# Patient Record
Sex: Female | Born: 1965
Health system: Southern US, Community
[De-identification: ages and names within clinical notes are randomized; demographics above are authoritative.]

## PROBLEM LIST (undated history)

## (undated) DIAGNOSIS — H81399 Other peripheral vertigo, unspecified ear: Secondary | ICD-10-CM

## (undated) DIAGNOSIS — M1990 Unspecified osteoarthritis, unspecified site: Secondary | ICD-10-CM

## (undated) DIAGNOSIS — H409 Unspecified glaucoma: Secondary | ICD-10-CM

## (undated) DIAGNOSIS — K219 Gastro-esophageal reflux disease without esophagitis: Secondary | ICD-10-CM

## (undated) DIAGNOSIS — Z9289 Personal history of other medical treatment: Secondary | ICD-10-CM

## (undated) DIAGNOSIS — R51 Headache: Secondary | ICD-10-CM

## (undated) DIAGNOSIS — Z800 Family history of malignant neoplasm of digestive organs: Secondary | ICD-10-CM

## (undated) DIAGNOSIS — Z803 Family history of malignant neoplasm of breast: Secondary | ICD-10-CM

## (undated) DIAGNOSIS — I341 Nonrheumatic mitral (valve) prolapse: Secondary | ICD-10-CM

## (undated) DIAGNOSIS — I10 Essential (primary) hypertension: Secondary | ICD-10-CM

## (undated) HISTORY — DX: Family history of malignant neoplasm of digestive organs: Z80.0

## (undated) HISTORY — DX: Unspecified glaucoma: H40.9

## (undated) HISTORY — DX: Nonrheumatic mitral (valve) prolapse: I34.1

## (undated) HISTORY — PX: KNEE ARTHROSCOPY: SUR90

## (undated) HISTORY — DX: Gastro-esophageal reflux disease without esophagitis: K21.9

## (undated) HISTORY — DX: Family history of malignant neoplasm of breast: Z80.3

## (undated) HISTORY — DX: Essential (primary) hypertension: I10

## (undated) HISTORY — DX: Other peripheral vertigo, unspecified ear: H81.399

---

## 2003-12-02 ENCOUNTER — Other Ambulatory Visit: Admission: RE | Admit: 2003-12-02 | Discharge: 2003-12-02 | Payer: Self-pay | Admitting: Obstetrics and Gynecology

## 2003-12-02 ENCOUNTER — Other Ambulatory Visit: Admission: RE | Admit: 2003-12-02 | Discharge: 2003-12-02 | Payer: Self-pay | Admitting: Internal Medicine

## 2003-12-28 ENCOUNTER — Ambulatory Visit (HOSPITAL_COMMUNITY): Admission: RE | Admit: 2003-12-28 | Discharge: 2003-12-28 | Payer: Self-pay | Admitting: Family Medicine

## 2005-07-10 ENCOUNTER — Other Ambulatory Visit: Admission: RE | Admit: 2005-07-10 | Discharge: 2005-07-10 | Payer: Self-pay | Admitting: Obstetrics and Gynecology

## 2009-04-23 ENCOUNTER — Inpatient Hospital Stay (HOSPITAL_COMMUNITY): Admission: AD | Admit: 2009-04-23 | Discharge: 2009-04-23 | Payer: Self-pay | Admitting: Obstetrics and Gynecology

## 2010-11-10 LAB — GC/CHLAMYDIA PROBE AMP, URINE
Chlamydia, Swab/Urine, PCR: NEGATIVE
GC Probe Amp, Urine: NEGATIVE

## 2010-11-10 LAB — CBC
Hemoglobin: 12.9 g/dL (ref 12.0–15.0)
RBC: 3.92 MIL/uL (ref 3.87–5.11)
WBC: 8 10*3/uL (ref 4.0–10.5)

## 2011-03-22 ENCOUNTER — Other Ambulatory Visit: Payer: Self-pay | Admitting: Family Medicine

## 2011-03-22 ENCOUNTER — Ambulatory Visit
Admission: RE | Admit: 2011-03-22 | Discharge: 2011-03-22 | Disposition: A | Discharge status: Home / Self Care | Payer: BC Managed Care – PPO | Source: Ambulatory Visit | Attending: Family Medicine | Admitting: Family Medicine

## 2011-03-22 DIAGNOSIS — R42 Dizziness and giddiness: Secondary | ICD-10-CM

## 2012-08-06 DIAGNOSIS — Z9289 Personal history of other medical treatment: Secondary | ICD-10-CM

## 2012-08-06 HISTORY — DX: Personal history of other medical treatment: Z92.89

## 2012-12-02 ENCOUNTER — Encounter: Payer: Self-pay | Admitting: Internal Medicine

## 2012-12-02 ENCOUNTER — Ambulatory Visit (INDEPENDENT_AMBULATORY_CARE_PROVIDER_SITE_OTHER): Payer: BC Managed Care – PPO | Admitting: Internal Medicine

## 2012-12-02 VITALS — BP 170/110 | HR 87 | Temp 97.8°F | Resp 18 | Ht 69.0 in | Wt 219.0 lb

## 2012-12-02 DIAGNOSIS — I059 Rheumatic mitral valve disease, unspecified: Secondary | ICD-10-CM

## 2012-12-02 DIAGNOSIS — I10 Essential (primary) hypertension: Secondary | ICD-10-CM | POA: Insufficient documentation

## 2012-12-02 DIAGNOSIS — H409 Unspecified glaucoma: Secondary | ICD-10-CM | POA: Insufficient documentation

## 2012-12-02 DIAGNOSIS — Z780 Asymptomatic menopausal state: Secondary | ICD-10-CM | POA: Insufficient documentation

## 2012-12-02 DIAGNOSIS — Z872 Personal history of diseases of the skin and subcutaneous tissue: Secondary | ICD-10-CM | POA: Insufficient documentation

## 2012-12-02 DIAGNOSIS — F329 Major depressive disorder, single episode, unspecified: Secondary | ICD-10-CM

## 2012-12-02 DIAGNOSIS — F32A Depression, unspecified: Secondary | ICD-10-CM | POA: Insufficient documentation

## 2012-12-02 DIAGNOSIS — I341 Nonrheumatic mitral (valve) prolapse: Secondary | ICD-10-CM

## 2012-12-02 LAB — CBC WITH DIFFERENTIAL/PLATELET
Basophils Absolute: 0 10*3/uL (ref 0.0–0.1)
Basophils Relative: 1 % (ref 0–1)
HCT: 37.1 % (ref 36.0–46.0)
Hemoglobin: 12.6 g/dL (ref 12.0–15.0)
Monocytes Absolute: 0.2 10*3/uL (ref 0.1–1.0)
Monocytes Relative: 4 % (ref 3–12)
Neutro Abs: 2.5 10*3/uL (ref 1.7–7.7)
Neutrophils Relative %: 56 % (ref 43–77)
Platelets: 234 10*3/uL (ref 150–400)
RBC: 4.23 MIL/uL (ref 3.87–5.11)
RDW: 13.5 % (ref 11.5–15.5)
WBC: 4.3 10*3/uL (ref 4.0–10.5)

## 2012-12-02 LAB — COMPREHENSIVE METABOLIC PANEL
AST: 22 U/L (ref 0–37)
Albumin: 4.9 g/dL (ref 3.5–5.2)
Alkaline Phosphatase: 94 U/L (ref 39–117)
Glucose, Bld: 100 mg/dL — ABNORMAL HIGH (ref 70–99)
Potassium: 4.6 mEq/L (ref 3.5–5.3)
Sodium: 138 mEq/L (ref 135–145)
Total Bilirubin: 0.6 mg/dL (ref 0.3–1.2)

## 2012-12-02 LAB — LIPID PANEL
HDL: 65 mg/dL (ref >39)
LDL Cholesterol: 101 mg/dL — ABNORMAL HIGH (ref 0–99)
Total CHOL/HDL Ratio: 2.8 Ratio

## 2012-12-02 LAB — TSH: TSH: 0.987 u[IU]/mL (ref 0.350–4.500)

## 2012-12-02 MED ORDER — AMLODIPINE BESYLATE 5 MG PO TABS: 5.0000 mg | ORAL_TABLET | Freq: Every day | ORAL | Status: DC

## 2012-12-02 MED ORDER — FUROSEMIDE 20 MG PO TABS: 20.0000 mg | ORAL_TABLET | Freq: Every day | ORAL | Status: DC

## 2012-12-02 MED ORDER — POTASSIUM CHLORIDE CRYS ER 20 MEQ PO TBCR
20.0000 meq | EXTENDED_RELEASE_TABLET | Freq: Every day | ORAL | Status: DC
Start: 2012-12-02 — End: 2013-04-16

## 2012-12-02 MED ORDER — CLONIDINE HCL 0.1 MG PO TABS
0.1000 mg | ORAL_TABLET | Freq: Once | ORAL | Status: AC
  Administered 2012-12-02: 0.1 mg via ORAL

## 2012-12-02 NOTE — Patient Instructions (Addendum)
See me in 2 weeks  Take medication as prescribed

## 2012-12-02 NOTE — Progress Notes (Signed)
Subjective:    Patient ID: Leah Moore, female    DOB: 1966-07-16, 47 y.o.   MRN: [id number]  HPI New pt here for first visit.    Leah Moore works at [employer].  Former care Dr. Nicholos Johns who she has not seen in quite a while  PMH of HTN since age 76,  depession off meds now, glaucoma, MVP, and chronic urticaria vs angioedema  ( lip swells randomly and when on Lisinopril).    Leah Moore reports that she "fell off the health wagon" as she was caring for her ill mother who lives in [place].  She has been off her BP meds for the last month and she does have a headache off and on.  She also tells me she cannot afford the Bystolic.  She has edema in hands and ankles off and on.  No swelling today.  See BP today  No speech or visual changes no paresthesias or muscle weakness  She was on  Zoloft and Cymbalta but off meds for the past 4-5 years now.    Diagnosed with MVP by Dr. Kemper Durie in the early 1990's   No recent chest pain or palpitaitons  She had lip swelling with Lisinopril and she described intermittant hives off and on that she takes benadryl for.    She also describes that she has had an endometrial biopsy at [facility] for heavy bleeding.  She was on OC's temporarily  Her LMP was in 08/2009  .  Her last mm was Oct 2013 at [facility]  She does describe frequent hot flushes and night sweats.    Allergies  Allergen Reactions  . Lisinopril    Past Medical History  Diagnosis Date  . Hypertension   . Glaucoma   . Mitral valve prolapse   . Vestibular vertigo    Past Surgical History  Procedure Laterality Date  . Knee arthroscopy     History   Social History  . Marital Status: Single    Spouse Name: N/A    Number of Children: N/A  . Years of Education: N/A   Occupational History  . Not on file.   Social History Main Topics  . Smoking status: Never Smoker   . Smokeless tobacco: Not on file  . Alcohol Use: Yes     Comment: socially  . Drug Use: No  . Sexually Active:  Yes    Birth Control/ Protection: Condom   Other Topics Concern  . Not on file   Social History Narrative  . No narrative on file   Family History  Problem Relation Age of Onset  . Arthritis Mother   . Hypertension Mother   . Diabetes Mother   . Glaucoma Mother   . COPD Mother   . Hypertension Father   . Stroke Father   . Hypertension Brother   . Glaucoma Brother   . Stroke Maternal Grandmother   . Heart disease Maternal Grandmother   . COPD Maternal Grandfather   . Heart disease Maternal Grandfather   . Cancer Maternal Grandfather     pancreatic  . Cancer Paternal Grandmother   . Hypertension Paternal Grandmother   . Arthritis Paternal Grandmother   . Heart disease Paternal Grandfather    There are no active problems to display for this patient.  No current outpatient prescriptions on file prior to visit.   No current facility-administered medications on file prior to visit.       Review of Systems See HPI  Objective:   Physical Exam Physical Exam  Nursing note and vitals reviewed.  Constitutional: She is oriented to person, place, and time. She appears well-developed and well-nourished.  HENT:  Head: Normocephalic and atraumatic.  Cardiovascular: Normal rate and regular rhythm. Exam reveals no gallop and no friction rub.  No murmur heard.  Pulmonary/Chest: Breath sounds normal. She has no wheezes. She has no rales.  Neurological: She is alert and oriented to person, place, and time.  Skin: Skin is warm and dry.  Psychiatric: She has a normal mood and affect. Her behavior is normal.  Ext no edema today        Assessment & Plan:  HTN uncontrolled.  Will give Clonidine 0.1 mg in office. EKG in office today  Poor R wave progression  Will order Amlodipine 5 mg daily along with  Lasix 20 mg daily with K-dur for K replacement.  She is to see me in  2 weeks or sooner as needed  MVP will get 2-D echo.    Chronic urticaria versus angioedema  Will refer  to allergist at some point  Glaucoma  Managed at [facility] today  See me in 2 weeks

## 2012-12-03 ENCOUNTER — Other Ambulatory Visit: Payer: Self-pay | Admitting: Internal Medicine

## 2012-12-03 ENCOUNTER — Encounter: Payer: Self-pay | Admitting: Internal Medicine

## 2012-12-03 DIAGNOSIS — E559 Vitamin D deficiency, unspecified: Secondary | ICD-10-CM | POA: Insufficient documentation

## 2012-12-03 LAB — VITAMIN D 25 HYDROXY (VIT D DEFICIENCY, FRACTURES): Vit D, 25-Hydroxy: 10 ng/mL — ABNORMAL LOW (ref 30–89)

## 2012-12-03 MED ORDER — ERGOCALCIFEROL 1.25 MG (50000 UT) PO CAPS: 50000.0000 [IU] | ORAL_CAPSULE | ORAL | Status: DC

## 2012-12-04 ENCOUNTER — Telehealth: Payer: Self-pay | Admitting: *Deleted

## 2012-12-04 ENCOUNTER — Encounter: Payer: Self-pay | Admitting: *Deleted

## 2012-12-04 ENCOUNTER — Encounter: Payer: Self-pay | Admitting: Internal Medicine

## 2012-12-04 NOTE — Telephone Encounter (Signed)
[facility] notified MM results will be faxed

## 2012-12-04 NOTE — Telephone Encounter (Signed)
Message copied by Mathews Robinsons on Thu Dec 04, 2012 11:23 AM ------      Message from: Raechel Chute D      Created: Wed Dec 03, 2012 11:28 AM       [name]            Call pt and let her know that her vitamin D level is very low.  I sent e-script in epic for Vitamin D 50,000 units to be taken once a week for 8 weeks and then counsel her to take 2000 units OTC daily.  Keep follow up appt with me            Ok to mail labs to her ------

## 2012-12-04 NOTE — Telephone Encounter (Signed)
Message copied by Mathews Robinsons on Thu Dec 04, 2012 11:12 AM ------      Message from: Raechel Chute D      Created: Wed Dec 03, 2012 11:28 AM       [name]            Call pt and let her know that her vitamin D level is very low.  I sent e-script in epic for Vitamin D 50,000 units to be taken once a week for 8 weeks and then counsel her to take 2000 units OTC daily.  Keep follow up appt with me            Ok to mail labs to her ------

## 2012-12-09 ENCOUNTER — Ambulatory Visit (HOSPITAL_COMMUNITY)
Admission: RE | Admit: 2012-12-09 | Discharge: 2012-12-09 | Disposition: A | Discharge status: Home / Self Care | Payer: BC Managed Care – PPO | Source: Ambulatory Visit | Attending: Internal Medicine | Admitting: Internal Medicine

## 2012-12-09 DIAGNOSIS — I341 Nonrheumatic mitral (valve) prolapse: Secondary | ICD-10-CM

## 2012-12-09 DIAGNOSIS — I10 Essential (primary) hypertension: Secondary | ICD-10-CM | POA: Insufficient documentation

## 2012-12-09 DIAGNOSIS — I059 Rheumatic mitral valve disease, unspecified: Secondary | ICD-10-CM | POA: Insufficient documentation

## 2012-12-09 NOTE — Progress Notes (Signed)
  Echocardiogram 2D Echocardiogram has been performed.  Leah Moore 12/09/2012, 12:06 PM

## 2012-12-11 ENCOUNTER — Telehealth: Payer: Self-pay | Admitting: *Deleted

## 2012-12-11 NOTE — Telephone Encounter (Signed)
LVM regarding 2 D echo

## 2012-12-11 NOTE — Telephone Encounter (Signed)
Message copied by Mathews Robinsons on Thu Dec 11, 2012  5:20 PM ------      Message from: Raechel Chute D      Created: Thu Dec 11, 2012  3:21 PM       [facility]            Call pt and let her know that her heart ECHO shows no mitral valve prolapse            Keep follow up appt with me ------

## 2012-12-16 ENCOUNTER — Ambulatory Visit (INDEPENDENT_AMBULATORY_CARE_PROVIDER_SITE_OTHER): Payer: BC Managed Care – PPO | Admitting: Internal Medicine

## 2012-12-16 ENCOUNTER — Encounter: Payer: Self-pay | Admitting: Internal Medicine

## 2012-12-16 ENCOUNTER — Telehealth: Payer: Self-pay | Admitting: Internal Medicine

## 2012-12-16 VITALS — BP 140/98 | HR 96 | Temp 97.3°F | Resp 18 | Wt 218.0 lb

## 2012-12-16 DIAGNOSIS — R002 Palpitations: Secondary | ICD-10-CM | POA: Insufficient documentation

## 2012-12-16 DIAGNOSIS — I10 Essential (primary) hypertension: Secondary | ICD-10-CM

## 2012-12-16 MED ORDER — CLONIDINE HCL 0.1 MG PO TABS: ORAL_TABLET | ORAL | Status: DC

## 2012-12-16 MED ORDER — FUROSEMIDE 20 MG PO TABS: 20.0000 mg | ORAL_TABLET | Freq: Every day | ORAL | Status: DC

## 2012-12-16 NOTE — Telephone Encounter (Signed)
Pt scheduled with Dr Jens Som Wednesday January 28, 2013 at 10:00 am.  Pt made aware of appt, date, time, & location by office.Brayton Caves

## 2012-12-16 NOTE — Patient Instructions (Signed)
To come to office in 7-10 days for BP check

## 2012-12-16 NOTE — Telephone Encounter (Signed)
First available appt is June 25th in [facility]

## 2012-12-16 NOTE — Progress Notes (Signed)
Subjective:    Patient ID: Leah Moore, female    DOB: 12-31-1965, 47 y.o.   MRN: [id number]  HPI Leah Moore is here for follow up after initiating Norvasc and Lasix. Headaches improved.  She does have some Bilateral swelling in her LE at end of day.  See BP.  Last visit  SPB 170   She also describes day and night hot flushes interrupting sleep and interfering with activities during day.  She does not wish to take HT.    She also had an episode while at work of bilateral hand numbness one week ago with palpitaitons.  She denies chest pain or pressure.   No SOB during episode,  No diaphoresis or N/V.  Symptoms resolved after about 1-2 hours.  No further episodes since last WEdnesday    Allergies  Allergen Reactions  . Lisinopril    Past Medical History  Diagnosis Date  . Hypertension   . Glaucoma   . Mitral valve prolapse   . Vestibular vertigo    Past Surgical History  Procedure Laterality Date  . Knee arthroscopy     History   Social History  . Marital Status: Single    Spouse Name: N/A    Number of Children: N/A  . Years of Education: N/A   Occupational History  . Not on file.   Social History Main Topics  . Smoking status: Never Smoker   . Smokeless tobacco: Not on file  . Alcohol Use: Yes     Comment: socially  . Drug Use: No  . Sexually Active: Yes    Birth Control/ Protection: Condom   Other Topics Concern  . Not on file   Social History Narrative  . No narrative on file   Family History  Problem Relation Age of Onset  . Arthritis Mother   . Hypertension Mother   . Diabetes Mother   . Glaucoma Mother   . COPD Mother   . Hypertension Father   . Stroke Father   . Hypertension Brother   . Glaucoma Brother   . Stroke Maternal Grandmother   . Heart disease Maternal Grandmother   . COPD Maternal Grandfather   . Heart disease Maternal Grandfather   . Cancer Maternal Grandfather     pancreatic  . Cancer Paternal Grandmother   . Hypertension  Paternal Grandmother   . Arthritis Paternal Grandmother   . Heart disease Paternal Grandfather    Patient Active Problem List   Diagnosis Date Noted  . Palpitations 12/16/2012  . Vitamin D deficiency 12/03/2012  . HTN (hypertension) 12/02/2012  . Depression 12/02/2012  . Menopause 12/02/2012  . H/O chronic urticaria 12/02/2012  . Glaucoma 12/02/2012   Current Outpatient Prescriptions on File Prior to Visit  Medication Sig Dispense Refill  . amLODipine (NORVASC) 5 MG tablet Take 1 tablet (5 mg total) by mouth daily.  90 tablet  1  . ergocalciferol (VITAMIN D2) 50000 UNITS capsule Take 1 capsule (50,000 Units total) by mouth once a week.  8 capsule  0  . ibuprofen (ADVIL,MOTRIN) 200 MG tablet Take 400 mg by mouth every 6 (six) hours as needed for pain.      Marland Kitchen latanoprost (XALATAN) 0.005 % ophthalmic solution 1 drop at bedtime.      Marland Kitchen loratadine (CLARITIN) 10 MG tablet Take 10 mg by mouth daily.      . potassium chloride SA (K-DUR,KLOR-CON) 20 MEQ tablet Take 1 tablet (20 mEq total) by mouth daily.  30 tablet  3  .  famotidine (PEPCID) 40 MG tablet Take 40 mg by mouth daily.       No current facility-administered medications on file prior to visit.      Review of Systems    see HPI Objective:   Physical Exam Physical Exam  Nursing note and vitals reviewed.  Constitutional: She is oriented to person, place, and time. She appears well-developed and well-nourished.  HENT:  Head: Normocephalic and atraumatic.  Cardiovascular: Normal rate and regular rhythm. Exam reveals no gallop and no friction rub.  No murmur heard.  Pulmonary/Chest: Breath sounds normal. She has no wheezes. She has no rales.  Neurological: She is alert and oriented to person, place, and time.  Skin: Skin is warm and dry.  Ext:  No edema today Psychiatric: She has a normal mood and affect. Her behavior is normal.         Assessment & Plan:  HTN  Improvement from last visit but not at goal.  With post  menopausal  vasomotor symptoms will add Clonidine 0.1 mg daily .  She is to return in 7-10 days for a BP check in the office and will assess if she needs bid dosing.  Hand numbness:    May be atypical cardiac symptoms.   EKG poor R wave progression  Will refer to cardiology.  I counseled pt to take ECASA 81 mg daily empirically.  Counseled if any symptoms of arm numbness, chest pain or pressure or SOB she is to go to ER for eval.    She voices understanding.  Vasomotor flushes   See above  Pt to return in 7-10 days for BP check

## 2012-12-17 NOTE — Telephone Encounter (Signed)
Spoke with Gavin Pound Dr. Waunita Schooner nurse.  She will call pt with appt.  Pt notified via telephone by myself

## 2012-12-18 ENCOUNTER — Encounter: Payer: Self-pay | Admitting: Cardiology

## 2012-12-18 ENCOUNTER — Ambulatory Visit (INDEPENDENT_AMBULATORY_CARE_PROVIDER_SITE_OTHER): Payer: BC Managed Care – PPO | Admitting: Cardiology

## 2012-12-18 VITALS — BP 142/97 | HR 101 | Ht 69.0 in | Wt 218.0 lb

## 2012-12-18 DIAGNOSIS — R0789 Other chest pain: Secondary | ICD-10-CM

## 2012-12-18 MED ORDER — METOPROLOL TARTRATE 25 MG PO TABS: ORAL_TABLET | ORAL | Status: DC

## 2012-12-18 NOTE — Progress Notes (Signed)
Leah Moore Date of Birth:  Jun 04, 1966 [facility] [address] [place], [place]  [address] [phone]         Fax   [phone]  History of Present Illness: This pleasant 47 year old woman is seen by me for the first time today.  She is being evaluated at the request of Dr. Constance Goltz for her symptoms of exertional chest tightness, palpitations, and an abnormal resting EKG.  She gives a history of remote mitral valve relapse diagnoses.  However a recent echocardiogram done on 12/09/12 did not show mitral valve prolapse.  It did demonstrate an ejection fraction of 65-70% and grade 1 diastolic dysfunction.  The patient has a history of long-standing high blood pressure since her 20s.  She also has a strong family history of blood pressure elevation and myocardial infarction.  She has had a history of low vitamin D levels.  She has had problems with exogenous obesity and inability to lose weight.  She has been sleeping poorly some of which she attributes to menopausal symptoms of hot flashes at night.  She has had what she describes as heartburn sometimes associated with exertion.  She has also had episodes of bilateral numbness of her hands.  She has continued to have problems with elevated blood pressure.  She recently was started on clonidine and she previously has been on amlodipine which she continues.  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  She has been trying to walk but has been discouraged about lack of success with weight loss.  Current Outpatient Prescriptions  Medication Sig Dispense Refill  . amLODipine (NORVASC) 5 MG tablet Take 1 tablet (5 mg total) by mouth daily.  90 tablet  1  . cetirizine (ZYRTEC) 10 MG tablet Take 10 mg by mouth daily.      . cloNIDine (CATAPRES) 0.1 MG tablet Take one tablet daily for 1 week then 1 tablet bid  60 tablet  1  . ergocalciferol (VITAMIN D2) 50000 UNITS capsule Take 1 capsule (50,000 Units  total) by mouth once a week.  8 capsule  0  . famotidine (PEPCID) 40 MG tablet Take 40 mg by mouth daily.      . furosemide (LASIX) 20 MG tablet Take 1 tablet (20 mg total) by mouth daily.  30 tablet  3  . ibuprofen (ADVIL,MOTRIN) 200 MG tablet Take 400 mg by mouth every 6 (six) hours as needed for pain.      Marland Kitchen latanoprost (XALATAN) 0.005 % ophthalmic solution 1 drop at bedtime.      . potassium chloride SA (K-DUR,KLOR-CON) 20 MEQ tablet Take 1 tablet (20 mEq total) by mouth daily.  30 tablet  3  . metoprolol tartrate (LOPRESSOR) 25 MG tablet 1/2 tablet twice a day  30 tablet  5   No current facility-administered medications for this visit.    Allergies  Allergen Reactions  . Lisinopril     Patient Active Problem List   Diagnosis Date Noted  . Palpitations 12/16/2012  . Vitamin D deficiency 12/03/2012  . HTN (hypertension) 12/02/2012  . Depression 12/02/2012  . Menopause 12/02/2012  . H/O chronic urticaria 12/02/2012  . Glaucoma 12/02/2012    History  Smoking status  . Never Smoker   Smokeless tobacco  . Not on file    History  Alcohol Use  . Yes    Comment: socially    Family History  Problem Relation Age of Onset  . Arthritis Mother   .  Hypertension Mother   . Diabetes Mother   . Glaucoma Mother   . COPD Mother   . Hypertension Father   . Stroke Father   . Hypertension Brother   . Glaucoma Brother   . Stroke Maternal Grandmother   . Heart disease Maternal Grandmother   . COPD Maternal Grandfather   . Heart disease Maternal Grandfather   . Cancer Maternal Grandfather     pancreatic  . Cancer Paternal Grandmother   . Hypertension Paternal Grandmother   . Arthritis Paternal Grandmother   . Heart disease Paternal Grandfather     Review of Systems: Constitutional: no fever chills diaphoresis or fatigue or change in weight.  Head and neck: no hearing loss, no epistaxis, no photophobia or visual disturbance. Respiratory: No cough, shortness of breath or  wheezing. Cardiovascular: Positive for exertional chest tightness Gastrointestinal: No abdominal distention, no abdominal pain, no change in bowel habits hematochezia or melena. Genitourinary: No dysuria, no frequency, no urgency, no nocturia. Musculoskeletal:No arthralgias, no back pain, no gait disturbance or myalgias. Neurological: No dizziness, no headaches, no numbness, no seizures, no syncope, no weakness, no tremors. Hematologic: No lymphadenopathy, no easy bruising. Psychiatric: No confusion, no hallucinations, no sleep disturbance.    Physical Exam: Filed Vitals:   12/18/12 1322  BP: 142/97  Pulse: 101   the general appearance reveals a well-developed well-nourished African American woman in no distress.The head and neck exam reveals pupils equal and reactive.  Extraocular movements are full.  There is no scleral icterus.  The mouth and pharynx are normal.  The neck is supple.  The carotids reveal no bruits.  The jugular venous pressure is normal.  The  thyroid is not enlarged.  There is no lymphadenopathy.  The chest is clear to percussion and auscultation.  There are no rales or rhonchi.  Expansion of the chest is symmetrical.  The precordium is quiet.  The first heart sound is normal.  The second heart sound is physiologically split.  There is no murmur gallop rub or click.  There is no abnormal lift or heave.  The abdomen is soft and nontender.  The bowel sounds are normal.  The liver and spleen are not enlarged.  There are no abdominal masses.  There are no abdominal bruits.  Extremities reveal good pedal pulses.  There is no phlebitis or edema.  There is no cyanosis or clubbing.  Strength is normal and symmetrical in all extremities.  There is no lateralizing weakness.  There are no sensory deficits.  The skin is warm and dry.  There is no rash.  EKG from 12/16/12 was reviewed.  There are nonspecific ST-T and T wave changes.  There is poor R-wave progression V1 and V2.  There is ST  segment elevation in V2 probably represents benign early repolarization.   Assessment / Plan: 1.  Atypical chest discomfort and tightness sometimes associated with bilateral hand numbness. 2. hypertension difficult to control 3. Palpitations 4. inability to lose weight 5. strong family history of hypertension and myocardial infarction.  Plan: For her blood pressure we will add metoprolol tartrate 12.5 mg twice a day.  She will continue her present doses of amlodipine and clonidine. We will have her return for a treadmill Myoview to evaluate her chest tightness and "indigestion" further.  She will omit her metoprolol the night before the stress test and the morning of the stress test. Many thanks for the opportunity to see this pleasant woman with you.

## 2012-12-18 NOTE — Patient Instructions (Addendum)
ADD METOPROLOL 25 MG 1/2 TABLET TWICE A DAY, RX SENT TO YOUR PHARMACY  Your physician has requested that you have en exercise stress myoview. For further information please visit [email or web address] Please follow instruction sheet, as given.

## 2012-12-23 ENCOUNTER — Encounter: Payer: Self-pay | Admitting: Internal Medicine

## 2012-12-24 ENCOUNTER — Ambulatory Visit (HOSPITAL_COMMUNITY): Payer: BC Managed Care – PPO | Attending: Cardiology | Admitting: Radiology

## 2012-12-24 VITALS — BP 125/99 | HR 85 | Ht 69.0 in | Wt 214.0 lb

## 2012-12-24 DIAGNOSIS — R110 Nausea: Secondary | ICD-10-CM | POA: Insufficient documentation

## 2012-12-24 DIAGNOSIS — R0989 Other specified symptoms and signs involving the circulatory and respiratory systems: Secondary | ICD-10-CM | POA: Insufficient documentation

## 2012-12-24 DIAGNOSIS — R0602 Shortness of breath: Secondary | ICD-10-CM

## 2012-12-24 DIAGNOSIS — I4949 Other premature depolarization: Secondary | ICD-10-CM

## 2012-12-24 DIAGNOSIS — R209 Unspecified disturbances of skin sensation: Secondary | ICD-10-CM | POA: Insufficient documentation

## 2012-12-24 DIAGNOSIS — R0609 Other forms of dyspnea: Secondary | ICD-10-CM | POA: Insufficient documentation

## 2012-12-24 DIAGNOSIS — R9431 Abnormal electrocardiogram [ECG] [EKG]: Secondary | ICD-10-CM

## 2012-12-24 DIAGNOSIS — R000 Tachycardia, unspecified: Secondary | ICD-10-CM | POA: Insufficient documentation

## 2012-12-24 DIAGNOSIS — R079 Chest pain, unspecified: Secondary | ICD-10-CM

## 2012-12-24 DIAGNOSIS — I10 Essential (primary) hypertension: Secondary | ICD-10-CM | POA: Insufficient documentation

## 2012-12-24 DIAGNOSIS — R61 Generalized hyperhidrosis: Secondary | ICD-10-CM | POA: Insufficient documentation

## 2012-12-24 DIAGNOSIS — Z8249 Family history of ischemic heart disease and other diseases of the circulatory system: Secondary | ICD-10-CM | POA: Insufficient documentation

## 2012-12-24 DIAGNOSIS — R0789 Other chest pain: Secondary | ICD-10-CM

## 2012-12-24 MED ORDER — TECHNETIUM TC 99M SESTAMIBI GENERIC - CARDIOLITE
10.9000 | Freq: Once | INTRAVENOUS | Status: AC | PRN
  Administered 2012-12-24: 10.9 via INTRAVENOUS

## 2012-12-24 MED ORDER — TECHNETIUM TC 99M SESTAMIBI GENERIC - CARDIOLITE
33.0000 | Freq: Once | INTRAVENOUS | Status: AC | PRN
  Administered 2012-12-24: 33 via INTRAVENOUS

## 2012-12-24 NOTE — Progress Notes (Signed)
[facility] SITE 3 NUCLEAR MED [address] [place], [place] [address] [phone]    Cardiology Nuclear Med Study  Leah Moore is a 47 y.o. female     MRN : [id number]     DOB: June 14, 1966  Procedure Date: 12/24/2012  Nuclear Med Background Indication for Stress Test:  Evaluation for Ischemia and Abnormal EKG History:  h/o MVP; 12/09/12 Echo:EF=70%, no MVP Cardiac Risk Factors: Family History - CAD and Hypertension  Symptoms:  Chest Tightness/(B) Hand Numbness with and without Exertion (last episode of chest discomfort was Sunday, 12/19/12), Diaphoresis, DOE, Nausea and Rapid HR   Nuclear Pre-Procedure Caffeine/Decaff Intake:  None > 12 hrs NPO After: 8:30pm   Lungs:  Clear. O2 Sat: 97% on room air. IV 0.9% NS with Angio Cath:  22g  IV Site: R Forearm x 1, tolerated well IV Started by:  Irean Hong, RN  Chest Size (in):  40 Cup Size: DD  Height: 5\' 9"  (1.753 m)  Weight:  214 lb (97.07 kg)  BMI:  Body mass index is 31.59 kg/(m^2). Tech Comments: Lopressor held x 24-hours; Norvasc today on arrival    Nuclear Med Study 1 or 2 day study: 1 day  Stress Test Type:  Stress  Reading MD: Willa Rough, MD  Order Authorizing Provider:  Cassell Clement, MD  Resting Radionuclide: Technetium 88m Sestamibi  Resting Radionuclide Dose: 10.9 mCi   Stress Radionuclide:  Technetium 45m Sestamibi  Stress Radionuclide Dose: 33.0 mCi           Stress Protocol Rest HR: 85 Stress HR: 171  Rest BP: 125/99 Stress BP: 217/111  Exercise Time (min): 4:21 METS: 6.2   Predicted Max HR: 173 bpm % Max HR: 98.84 bpm Rate Pressure Product: 21308   Dose of Adenosine (mg):  n/a Dose of Lexiscan: n/a mg  Dose of Atropine (mg): n/a Dose of Dobutamine: n/a mcg/kg/min (at max HR)  Stress Test Technologist: Smiley Houseman, CMA-N  Nuclear Technologist:  Domenic Polite, CNMT     Rest Procedure:  Myocardial perfusion imaging was performed at rest 45 minutes following the intravenous  administration of Technetium 58m Sestamibi.  Rest ECG: Normal sinus rhythm with nonspecific ST-T wave abnormalities.  Stress Procedure:  The patient exercised on the treadmill utilizing the Bruce Protocol for 4:21 minutes. The patient stopped due to fatigue and a hypertensive response.  She did c/o chest tightness immediately post exercise.  Technetium 36m Sestamibi was injected at peak exercise and myocardial perfusion imaging was performed after a brief delay.  Stress ECG: No significant change from baseline ECG  QPS Raw Data Images:  Normal; no motion artifact; normal heart/lung ratio. Stress Images:  Normal homogeneous uptake in all areas of the myocardium. Rest Images:  Normal homogeneous uptake in all areas of the myocardium. Subtraction (SDS):  No evidence of ischemia. Transient Ischemic Dilatation (Normal <1.22):  1.01 Lung/Heart Ratio (Normal <0.45):  0.33  Quantitative Gated Spect Images QGS EDV:  86 ml QGS ESV:  28 ml  Impression Exercise Capacity:  Limited exercise capacity. There was early marked hypertensive response with fatigue. In recovery there was some chest pressure. BP Response:  Hypertensive blood pressure response. Clinical Symptoms:  fatigue and chest discomfort ECG Impression:  No significant ST segment change suggestive of ischemia. Comparison with Prior Nuclear Study: No previous nuclear study performed  Overall Impression:  Normal stress nuclear study. There is no scar or ischemia. The patient had very limited exercise tolerance. There was a significant hypertensive  response early in stress. The nuclear images reveal no significant abnormalities. This is a low risk scan.  LV Ejection Fraction: 67%.  LV Wall Motion:  Normal wall motion.  Willa Rough, MD

## 2012-12-30 ENCOUNTER — Telehealth: Payer: Self-pay | Admitting: *Deleted

## 2012-12-30 NOTE — Telephone Encounter (Signed)
Message copied by Burnell Blanks on Tue Dec 30, 2012  4:51 PM ------      Message from: Cassell Clement      Created: Sun Dec 28, 2012  6:50 PM       Please report.  The stress test was normal and did not show any ischemia or blockage. BP increased rapidly with the exercise but of course her metoprolol was held for the test. She should follow up with her PCP regarding further adjustments of her BP meds if necessary and see [place] back PRN.  Copy of stress test to PCP. ------

## 2012-12-30 NOTE — Telephone Encounter (Signed)
Advised patient and will send to PCP Kendrick Ranch, MD

## 2013-01-28 ENCOUNTER — Institutional Professional Consult (permissible substitution): Payer: BC Managed Care – PPO | Admitting: Cardiology

## 2013-04-16 ENCOUNTER — Ambulatory Visit (INDEPENDENT_AMBULATORY_CARE_PROVIDER_SITE_OTHER): Payer: 59 | Admitting: Internal Medicine

## 2013-04-16 ENCOUNTER — Encounter: Payer: Self-pay | Admitting: Internal Medicine

## 2013-04-16 VITALS — BP 129/84 | HR 84 | Temp 97.9°F | Resp 18 | Wt 217.0 lb

## 2013-04-16 DIAGNOSIS — I10 Essential (primary) hypertension: Secondary | ICD-10-CM

## 2013-04-16 DIAGNOSIS — E669 Obesity, unspecified: Secondary | ICD-10-CM | POA: Insufficient documentation

## 2013-04-16 MED ORDER — FUROSEMIDE 20 MG PO TABS: 20.0000 mg | ORAL_TABLET | Freq: Every day | ORAL | Status: DC

## 2013-04-16 MED ORDER — POTASSIUM CHLORIDE CRYS ER 20 MEQ PO TBCR: 20.0000 meq | EXTENDED_RELEASE_TABLET | Freq: Every day | ORAL | Status: DC

## 2013-04-16 MED ORDER — LORAZEPAM 1 MG PO TABS: ORAL_TABLET | ORAL | Status: DC

## 2013-04-16 NOTE — Progress Notes (Signed)
Subjective:    Patient ID: Leah Moore, female    DOB: [birth year], 47 y.o.   MRN: [id number]  HPI  Leah Moore is here for follow up  Atypical chest pain  See nuclear scan  Neg ischemia.  She has not had any furhter episodes of pressure or pain.  She had significant hypertensive response and now in on beta blocker  HTN well controlled but she would like to try to come off the amlodipine.  States her headaches are gone  HOt flushes:  Discussed going to bid clonidine as she does not like the norvasc  Obesity  She has lost 4 lbs since April and would like to see Dr. Kinnie Scales  Insomnia  :  Cousin died of HIV,  Daughter stressed financially pt cannot sleep  Allergies  Allergen Reactions  . Lisinopril    Past Medical History  Diagnosis Date  . Hypertension   . Glaucoma   . Mitral valve prolapse   . Vestibular vertigo    Past Surgical History  Procedure Laterality Date  . Knee arthroscopy     History   Social History  . Marital Status: Single    Spouse Name: N/A    Number of Children: N/A  . Years of Education: N/A   Occupational History  . Not on file.   Social History Main Topics  . Smoking status: Never Smoker   . Smokeless tobacco: Not on file  . Alcohol Use: Yes     Comment: socially  . Drug Use: No  . Sexual Activity: Yes    Birth Control/ Protection: Condom   Other Topics Concern  . Not on file   Social History Narrative  . No narrative on file   Family History  Problem Relation Age of Onset  . Arthritis Mother   . Hypertension Mother   . Diabetes Mother   . Glaucoma Mother   . COPD Mother   . Hypertension Father   . Stroke Father   . Hypertension Brother   . Glaucoma Brother   . Stroke Maternal Grandmother   . Heart disease Maternal Grandmother   . COPD Maternal Grandfather   . Heart disease Maternal Grandfather   . Cancer Maternal Grandfather     pancreatic  . Cancer Paternal Grandmother   . Hypertension Paternal Grandmother   . Arthritis  Paternal Grandmother   . Heart disease Paternal Grandfather    Patient Active Problem List   Diagnosis Date Noted  . Obesity 04/16/2013  . Palpitations 12/16/2012  . Vitamin D deficiency 12/03/2012  . HTN (hypertension) 12/02/2012  . Depression 12/02/2012  . Menopause 12/02/2012  . H/O chronic urticaria 12/02/2012  . Glaucoma 12/02/2012   Current Outpatient Prescriptions on File Prior to Visit  Medication Sig Dispense Refill  . cetirizine (ZYRTEC) 10 MG tablet Take 10 mg by mouth daily.      . cloNIDine (CATAPRES) 0.1 MG tablet Take one tablet daily for 1 week then 1 tablet bid  60 tablet  1  . famotidine (PEPCID) 40 MG tablet Take 40 mg by mouth daily.      Marland Kitchen ibuprofen (ADVIL,MOTRIN) 200 MG tablet Take 400 mg by mouth every 6 (six) hours as needed for pain.      Marland Kitchen latanoprost (XALATAN) 0.005 % ophthalmic solution 1 drop at bedtime.      . metoprolol tartrate (LOPRESSOR) 25 MG tablet 1/2 tablet twice a day  30 tablet  5   No current facility-administered medications on file prior to  visit.      Review of Systems See HPI    Objective:   Physical Exam  Physical Exam  Nursing note and vitals reviewed.  Constitutional: She is oriented to person, place, and time. She appears well-developed and well-nourished.  HENT:  Head: Normocephalic and atraumatic.  Cardiovascular: Normal rate and regular rhythm. Exam reveals no gallop and no friction rub.  No murmur heard.  Pulmonary/Chest: Breath sounds normal. She has no wheezes. She has no rales.  Neurological: She is alert and oriented to person, place, and time.  Skin: Skin is warm and dry. Ext no edema  Psychiatric: She has a normal mood and affect. Her behavior is normal.          Assessment & Plan:  HTN  OK to go off norvasc but will increase clonidine to bid 0.1 mg  Recheck with me in 4-6 weeks  Insomnia due to situational stresses  OK for Ativan 1 mg 3 times a week  Obesity   OK to refer to Dr. Kinnie Scales  Hot flushes   See above she will be on clonidine bid

## 2013-04-16 NOTE — Patient Instructions (Addendum)
See me in 4-6 weeks

## 2013-04-17 ENCOUNTER — Other Ambulatory Visit: Payer: Self-pay | Admitting: *Deleted

## 2013-04-17 ENCOUNTER — Telehealth: Payer: Self-pay | Admitting: *Deleted

## 2013-04-17 DIAGNOSIS — R635 Abnormal weight gain: Secondary | ICD-10-CM

## 2013-04-17 DIAGNOSIS — E669 Obesity, unspecified: Secondary | ICD-10-CM

## 2013-04-17 NOTE — Telephone Encounter (Signed)
Faxed office notes demographics and insurance info to [facility] office.  Notified pt via VM

## 2013-04-22 ENCOUNTER — Encounter: Payer: Self-pay | Admitting: Internal Medicine

## 2013-05-07 ENCOUNTER — Other Ambulatory Visit (HOSPITAL_BASED_OUTPATIENT_CLINIC_OR_DEPARTMENT_OTHER): Payer: Self-pay | Admitting: *Deleted

## 2013-05-07 DIAGNOSIS — Z1231 Encounter for screening mammogram for malignant neoplasm of breast: Secondary | ICD-10-CM

## 2013-05-08 ENCOUNTER — Other Ambulatory Visit: Payer: Self-pay | Admitting: Internal Medicine

## 2013-05-08 ENCOUNTER — Ambulatory Visit (HOSPITAL_BASED_OUTPATIENT_CLINIC_OR_DEPARTMENT_OTHER)
Admission: RE | Admit: 2013-05-08 | Discharge: 2013-05-08 | Disposition: A | Discharge status: Home / Self Care | Payer: 59 | Source: Ambulatory Visit | Attending: Internal Medicine | Admitting: Internal Medicine

## 2013-05-08 DIAGNOSIS — Z1231 Encounter for screening mammogram for malignant neoplasm of breast: Secondary | ICD-10-CM

## 2013-05-18 ENCOUNTER — Encounter (HOSPITAL_BASED_OUTPATIENT_CLINIC_OR_DEPARTMENT_OTHER): Payer: Self-pay | Admitting: Emergency Medicine

## 2013-05-18 ENCOUNTER — Emergency Department (HOSPITAL_BASED_OUTPATIENT_CLINIC_OR_DEPARTMENT_OTHER)
Admission: EM | Admit: 2013-05-18 | Discharge: 2013-05-18 | Disposition: A | Discharge status: Home / Self Care | Payer: 59 | Attending: Emergency Medicine | Admitting: Emergency Medicine

## 2013-05-18 ENCOUNTER — Ambulatory Visit (INDEPENDENT_AMBULATORY_CARE_PROVIDER_SITE_OTHER): Payer: 59 | Admitting: Internal Medicine

## 2013-05-18 ENCOUNTER — Encounter: Payer: Self-pay | Admitting: Internal Medicine

## 2013-05-18 ENCOUNTER — Encounter (HOSPITAL_BASED_OUTPATIENT_CLINIC_OR_DEPARTMENT_OTHER): Payer: Self-pay

## 2013-05-18 ENCOUNTER — Other Ambulatory Visit: Payer: Self-pay | Admitting: Internal Medicine

## 2013-05-18 ENCOUNTER — Ambulatory Visit (HOSPITAL_BASED_OUTPATIENT_CLINIC_OR_DEPARTMENT_OTHER)
Admission: RE | Admit: 2013-05-18 | Discharge: 2013-05-18 | Disposition: A | Discharge status: Home / Self Care | Payer: 59 | Source: Ambulatory Visit | Attending: Internal Medicine | Admitting: Internal Medicine

## 2013-05-18 VITALS — BP 152/95 | Wt 213.0 lb

## 2013-05-18 DIAGNOSIS — R1031 Right lower quadrant pain: Secondary | ICD-10-CM

## 2013-05-18 DIAGNOSIS — I10 Essential (primary) hypertension: Secondary | ICD-10-CM | POA: Insufficient documentation

## 2013-05-18 DIAGNOSIS — M549 Dorsalgia, unspecified: Secondary | ICD-10-CM | POA: Insufficient documentation

## 2013-05-18 DIAGNOSIS — R52 Pain, unspecified: Secondary | ICD-10-CM | POA: Insufficient documentation

## 2013-05-18 DIAGNOSIS — Z79899 Other long term (current) drug therapy: Secondary | ICD-10-CM | POA: Insufficient documentation

## 2013-05-18 DIAGNOSIS — Z8669 Personal history of other diseases of the nervous system and sense organs: Secondary | ICD-10-CM | POA: Insufficient documentation

## 2013-05-18 DIAGNOSIS — R1032 Left lower quadrant pain: Secondary | ICD-10-CM

## 2013-05-18 DIAGNOSIS — R110 Nausea: Secondary | ICD-10-CM

## 2013-05-18 DIAGNOSIS — R109 Unspecified abdominal pain: Secondary | ICD-10-CM

## 2013-05-18 LAB — CBC WITH DIFFERENTIAL/PLATELET
Basophils Absolute: 0 10*3/uL (ref 0.0–0.1)
Eosinophils Absolute: 0.1 10*3/uL (ref 0.0–0.7)
Eosinophils Relative: 3 % (ref 0–5)
HCT: 35.8 % — ABNORMAL LOW (ref 36.0–46.0)
HCT: 36.4 % (ref 36.0–46.0)
Hemoglobin: 12.2 g/dL (ref 12.0–15.0)
Hemoglobin: 11.9 g/dL — ABNORMAL LOW (ref 12.0–15.0)
Lymphocytes Relative: 43 % (ref 12–46)
Lymphs Abs: 1.5 10*3/uL (ref 0.7–4.0)
Lymphs Abs: 2.2 10*3/uL (ref 0.7–4.0)
MCH: 28.8 pg (ref 26.0–34.0)
MCV: 84.6 fL (ref 78.0–100.0)
MCV: 89.7 fL (ref 78.0–100.0)
Monocytes Absolute: 0.2 10*3/uL (ref 0.1–1.0)
Monocytes Absolute: 0.3 10*3/uL (ref 0.1–1.0)
Monocytes Relative: 6 % (ref 3–12)
Monocytes Relative: 7 % (ref 3–12)
Neutro Abs: 2.4 10*3/uL (ref 1.7–7.7)
Neutrophils Relative %: 56 % (ref 43–77)
RBC: 4.23 MIL/uL (ref 3.87–5.11)
RBC: 4.06 MIL/uL (ref 3.87–5.11)
RDW: 13.3 % (ref 11.5–15.5)
WBC: 5.1 10*3/uL (ref 4.0–10.5)

## 2013-05-18 LAB — COMPREHENSIVE METABOLIC PANEL
AST: 21 U/L (ref 0–37)
Albumin: 4.4 g/dL (ref 3.5–5.2)
Alkaline Phosphatase: 109 U/L (ref 39–117)
BUN: 14 mg/dL (ref 6–23)
CO2: 31 mEq/L (ref 19–32)
CO2: 29 mEq/L (ref 19–32)
Calcium: 10 mg/dL (ref 8.4–10.5)
Chloride: 101 mEq/L (ref 96–112)
Creatinine, Ser: 0.8 mg/dL (ref 0.50–1.10)
GFR calc Af Amer: >90 mL/min (ref >90)
GFR calc non Af Amer: 86 mL/min — ABNORMAL LOW (ref >90)
Glucose, Bld: 101 mg/dL — ABNORMAL HIGH (ref 70–99)
Glucose, Bld: 83 mg/dL (ref 70–99)
Potassium: 4.1 mEq/L (ref 3.5–5.1)
Sodium: 140 mEq/L (ref 135–145)
Total Bilirubin: 0.5 mg/dL (ref 0.3–1.2)
Total Bilirubin: 0.4 mg/dL (ref 0.3–1.2)
Total Protein: 7.4 g/dL (ref 6.0–8.3)

## 2013-05-18 LAB — URINALYSIS, ROUTINE W REFLEX MICROSCOPIC
Glucose, UA: NEGATIVE mg/dL
Hgb urine dipstick: NEGATIVE
Protein, ur: NEGATIVE mg/dL
pH: 5.5 (ref 5.0–8.0)

## 2013-05-18 LAB — URINE MICROSCOPIC-ADD ON

## 2013-05-18 MED ORDER — ONDANSETRON 4 MG PO TBDP
4.0000 mg | ORAL_TABLET | Freq: Once | ORAL | Status: AC
  Administered 2013-05-18: 4 mg via ORAL
  Filled 2013-05-18: qty 1

## 2013-05-18 MED ORDER — IOHEXOL 300 MG/ML  SOLN
100.0000 mL | Freq: Once | INTRAMUSCULAR | Status: AC | PRN
  Administered 2013-05-18: 100 mL via INTRAVENOUS

## 2013-05-18 MED ORDER — INSULIN ASPART 100 UNIT/ML ~~LOC~~ SOLN: 10.0000 [IU] | Freq: Once | SUBCUTANEOUS | Status: DC

## 2013-05-18 MED ORDER — MORPHINE SULFATE 2 MG/ML IJ SOLN
2.0000 mg | Freq: Once | INTRAMUSCULAR | Status: AC
  Administered 2013-05-18: 2 mg via INTRAVENOUS
  Filled 2013-05-18: qty 1

## 2013-05-18 MED ORDER — OXYCODONE-ACETAMINOPHEN 5-325 MG PO TABS: 1.0000 | ORAL_TABLET | Freq: Four times a day (QID) | ORAL | Status: DC | PRN

## 2013-05-18 NOTE — Progress Notes (Signed)
Subjective:    Patient ID: Leah Moore, female    DOB: 1966/05/25, 47 y.o.   MRN: [id number]  HPI [name] is here for acute visit.    She was seen at [facility] walk in clinic yesterday and was advised to be evaluated by [facility] ER and that she would need an emergent CT to rule out appendicitis or kidney stone but pt did not go because it would  Cost her a $300.00 co-pay  Pt describes worsening abd pain  That began in RLQ and was associated with nausea and diarrhea.  Her stools are soft now.  No vomiting no dysuria  No documented fever no vaginal discharge.  NO blood or change in color of stool.     Pain  Now alternates between lower quadrants but today seems worse in RUQ and radiates to back. She does describe midsternal chest pressure.    Chest pressure does not radiate down L side or into jaw,  NO SOB, no diaphoresis.    Allergies  Allergen Reactions  . Lisinopril    Past Medical History  Diagnosis Date  . Hypertension   . Glaucoma   . Mitral valve prolapse   . Vestibular vertigo    Past Surgical History  Procedure Laterality Date  . Knee arthroscopy     History   Social History  . Marital Status: Single    Spouse Name: N/A    Number of Children: N/A  . Years of Education: N/A   Occupational History  . Not on file.   Social History Main Topics  . Smoking status: Never Smoker   . Smokeless tobacco: Not on file  . Alcohol Use: Yes     Comment: socially  . Drug Use: No  . Sexual Activity: Yes    Birth Control/ Protection: Condom   Other Topics Concern  . Not on file   Social History Narrative  . No narrative on file   Family History  Problem Relation Age of Onset  . Arthritis Mother   . Hypertension Mother   . Diabetes Mother   . Glaucoma Mother   . COPD Mother   . Hypertension Father   . Stroke Father   . Hypertension Brother   . Glaucoma Brother   . Stroke Maternal Grandmother   . Heart disease Maternal Grandmother   . COPD Maternal Grandfather   .  Heart disease Maternal Grandfather   . Cancer Maternal Grandfather     pancreatic  . Cancer Paternal Grandmother   . Hypertension Paternal Grandmother   . Arthritis Paternal Grandmother   . Heart disease Paternal Grandfather    Patient Active Problem List   Diagnosis Date Noted  . Obesity 04/16/2013  . Palpitations 12/16/2012  . Vitamin D deficiency 12/03/2012  . HTN (hypertension) 12/02/2012  . Depression 12/02/2012  . Menopause 12/02/2012  . H/O chronic urticaria 12/02/2012  . Glaucoma 12/02/2012   Current Outpatient Prescriptions on File Prior to Visit  Medication Sig Dispense Refill  . cetirizine (ZYRTEC) 10 MG tablet Take 10 mg by mouth daily.      . cloNIDine (CATAPRES) 0.1 MG tablet Take one tablet daily for 1 week then 1 tablet bid  60 tablet  1  . famotidine (PEPCID) 40 MG tablet Take 40 mg by mouth daily.      . furosemide (LASIX) 20 MG tablet Take 1 tablet (20 mg total) by mouth daily.  30 tablet  3  . ibuprofen (ADVIL,MOTRIN) 200 MG tablet Take 400 mg  by mouth every 6 (six) hours as needed for pain.      Marland Kitchen latanoprost (XALATAN) 0.005 % ophthalmic solution 1 drop at bedtime.      Marland Kitchen LORazepam (ATIVAN) 1 MG tablet Take one tablet two or three times a week for insomnia  20 tablet  1  . metoprolol tartrate (LOPRESSOR) 25 MG tablet 1/2 tablet twice a day  30 tablet  5  . potassium chloride SA (K-DUR,KLOR-CON) 20 MEQ tablet Take 1 tablet (20 mEq total) by mouth daily.  30 tablet  3   No current facility-administered medications on file prior to visit.       Review of Systems See HPI    Objective:   Physical Exam   Physical Exam  Nursing note and vitals reviewed.  Constitutional: She is oriented to person, place, and time. She appears well-developed and well-nourished.  HENT:  Head: Normocephalic and atraumatic.  Cardiovascular: Normal rate and regular rhythm. Exam reveals no gallop and no friction rub.  No murmur heard.  Pulmonary/Chest: Breath sounds normal.  She has no wheezes. She has no rales.  Abd  BS quiet but present. Neurological: She is alert and oriented to person, place, and time.  Skin: Skin is warm and dry.  Psychiatric: She has a normal mood and affect. Her behavior is normal.           Assessment & Plan:  Chest pressure  EKG  No acute changes  Abd pain will get CT and labs today.  Addendum   CT shows dilated appendix no stranding.  I do not have WBC count back yet  I spoke with pt over phone while in Xray  Will send to ER for further assessment,  And surgical opinion.  Pt voices understanding  I spoke with Amy charge nurse and informed in ER

## 2013-05-18 NOTE — ED Notes (Signed)
Pt sts. She no longer has Mitral valve prolapse since May 2014.

## 2013-05-18 NOTE — ED Notes (Signed)
Rt abd pain w nausea off and on since 05/09/13  Radiating to rt shoulder

## 2013-05-18 NOTE — ED Provider Notes (Addendum)
CSN: [id number]     Arrival date & time 05/18/13  1408 History   First MD Initiated Contact with Patient 05/18/13 1442     Chief Complaint  Patient presents with  . Abdominal Pain  . Back Pain   (Consider location/radiation/quality/duration/timing/severity/associated sxs/prior Treatment) Patient is a 47 y.o. female presenting with abdominal pain.  Abdominal Pain Pain location:  RUQ, RLQ and R flank Pain quality: aching   Timing:  Intermittent Progression:  Waxing and waning Ineffective treatments:  None tried Associated symptoms: nausea   Associated symptoms: no diarrhea     Past Medical History  Diagnosis Date  . Hypertension   . Glaucoma   . Mitral valve prolapse   . Vestibular vertigo    Past Surgical History  Procedure Laterality Date  . Knee arthroscopy     Family History  Problem Relation Age of Onset  . Arthritis Mother   . Hypertension Mother   . Diabetes Mother   . Glaucoma Mother   . COPD Mother   . Hypertension Father   . Stroke Father   . Hypertension Brother   . Glaucoma Brother   . Stroke Maternal Grandmother   . Heart disease Maternal Grandmother   . COPD Maternal Grandfather   . Heart disease Maternal Grandfather   . Cancer Maternal Grandfather     pancreatic  . Cancer Paternal Grandmother   . Hypertension Paternal Grandmother   . Arthritis Paternal Grandmother   . Heart disease Paternal Grandfather    History  Substance Use Topics  . Smoking status: Never Smoker   . Smokeless tobacco: Not on file  . Alcohol Use: Yes     Comment: socially   OB History   Grav Para Term Preterm Abortions TAB SAB Ect Mult Living   3    2  2   1      Review of Systems  Gastrointestinal: Positive for nausea and abdominal pain. Negative for diarrhea.  All other systems reviewed and are negative.    Allergies  Lisinopril  Home Medications   Current Outpatient Rx  Name  Route  Sig  Dispense  Refill  . cetirizine (ZYRTEC) 10 MG tablet   Oral    Take 10 mg by mouth daily.         . cloNIDine (CATAPRES) 0.1 MG tablet      Take one tablet daily for 1 week then 1 tablet bid   60 tablet   1   . famotidine (PEPCID) 40 MG tablet   Oral   Take 40 mg by mouth daily.         . furosemide (LASIX) 20 MG tablet   Oral   Take 1 tablet (20 mg total) by mouth daily.   30 tablet   3   . ibuprofen (ADVIL,MOTRIN) 200 MG tablet   Oral   Take 400 mg by mouth every 6 (six) hours as needed for pain.         Marland Kitchen latanoprost (XALATAN) 0.005 % ophthalmic solution      1 drop at bedtime.         Marland Kitchen LORazepam (ATIVAN) 1 MG tablet      Take one tablet two or three times a week for insomnia   20 tablet   1   . metoprolol tartrate (LOPRESSOR) 25 MG tablet      1/2 tablet twice a day   30 tablet   5   . potassium chloride SA (K-DUR,KLOR-CON) 20 MEQ tablet  Oral   Take 1 tablet (20 mEq total) by mouth daily.   30 tablet   3    BP 158/109  Pulse 75  Temp(Src) 98.6 F (37 C) (Oral)  Resp 20  Ht 5\' 9"  (1.753 m)  Wt 213 lb (96.616 kg)  BMI 31.44 kg/m2  SpO2 98% Physical Exam  Nursing note and vitals reviewed. Constitutional: She is oriented to person, place, and time. She appears well-developed and well-nourished.  HENT:  Head: Normocephalic.  Eyes: Pupils are equal, round, and reactive to light.  Cardiovascular: Normal rate and regular rhythm.   Pulmonary/Chest: Effort normal and breath sounds normal.  Abdominal: Soft. She exhibits no distension. There is no rebound and no guarding.  Musculoskeletal: She exhibits no edema and no tenderness.  Lymphadenopathy:    She has no cervical adenopathy.  Neurological: She is alert and oriented to person, place, and time.  Skin: Skin is warm and dry.  Psychiatric: She has a normal mood and affect. Her behavior is normal. Judgment and thought content normal.    ED Course  Procedures (including critical care time) Labs Review Labs Reviewed  CBC WITH DIFFERENTIAL  URINALYSIS,  ROUTINE W REFLEX MICROSCOPIC  COMPREHENSIVE METABOLIC PANEL   Imaging Review Ct Abdomen Pelvis W Contrast  05/18/2013   CLINICAL DATA:  Acute right lower quadrant pain.  EXAM: CT ABDOMEN AND PELVIS WITH CONTRAST  TECHNIQUE: Multidetector CT imaging of the abdomen and pelvis was performed using the standard protocol following bolus administration of intravenous contrast.  CONTRAST:  OMNIPAQUE IOHEXOL 300 MG/ML  SOLN  COMPARISON:  None.  FINDINGS: There is slight a prominence of the appendix. It measures 8 mm in diameter. There is no appreciable periappendiceal soft tissue stranding.  The liver, biliary tree, spleen, pancreas, adrenal glands, and kidneys are normal with exception of a tiny cyst in the anterior aspect of the right lobe of the liver, 5 mm.  The terminal ileum is normal. No diverticular disease. Uterus and ovaries are normal. No acute osseous abnormality. Moderate bilateral facet arthritis at L5-S1.  IMPRESSION: 1. The appendix is slightly dilated but there is no periappendiceal soft tissue stranding or other secondary signs of appendicitis. 2. Otherwise benign appearing abdomen. Is the patient's white count elevated? Does the patient have a fever?   Electronically Signed   By: Geanie Cooley M.D.   On: 05/18/2013 13:54    EKG Interpretation   None      Patient sent from PCP office after CT scan revealed slightly dilated appendix without stranding.  Office did not have results from blood work available for review.   Patient reports having RLQ pain since October 4.  Pain occasionally radiates to right shoulder.  Nausea without emesis.  Lack of appetite.   Lab and radiology results reviewed, shared with patient.   Discussed with Dr. Ezzard Standing with [facility]--he has reviewed CT results.  No concerning findings at present.  Patient may follow-up in the office.  Treatment plan discussed with patient, who is agreeable.  Pain medication, PCP, [facility] follow-up.  Return precautions discussed.   MDM   Abdominal pain.    Jimmye Norman, NP 05/18/13 1708  Jimmye Norman, NP 05/29/13 [phone]

## 2013-05-18 NOTE — Patient Instructions (Signed)
To go to ER after xray

## 2013-05-18 NOTE — ED Notes (Signed)
Pt was seen at [facility] for lower to upper R sided back pain and R abdominal side pain. Doctor from upstairs sent patient to ED after scan, said appendix were enlarged.  Pt c/o nausea. Denies Diarrhea.

## 2013-05-19 ENCOUNTER — Telehealth: Payer: Self-pay | Admitting: *Deleted

## 2013-05-19 NOTE — Telephone Encounter (Signed)
Refill request

## 2013-05-19 NOTE — ED Provider Notes (Signed)
Medical screening examination/treatment/procedure(s) were performed by non-physician practitioner and as supervising physician I was immediately available for consultation/collaboration.   Geniyah Eischeid E Sophronia Varney, MD 05/19/13 1107 

## 2013-05-19 NOTE — Telephone Encounter (Signed)
Message copied by Mathews Robinsons on Tue May 19, 2013 11:57 AM ------      Message from: Raechel Chute D      Created: Tue May 19, 2013  8:29 AM       [name]        Call pt and check on her.  Surgeon on call wants her to follow up with [facility]  (ER provider spoke with Dr. Ezzard Standing  )  ADvise her also to keep her appt with Dr. Alberteen Sam ------

## 2013-05-19 NOTE — Telephone Encounter (Signed)
Pt states that she is still not feeling well and is nauseated. Advised pt to keep appt with Dr Alberteen Sam and if sx change or worsen throughout the day or night to seek emergency care

## 2013-05-19 NOTE — Telephone Encounter (Signed)
Message copied by Mathews Robinsons on Tue May 19, 2013 11:48 AM ------      Message from: Raechel Chute D      Created: Tue May 19, 2013  8:29 AM       [name]        Call pt and check on her.  Surgeon on call wants her to follow up with [facility]  (ER provider spoke with Dr. Ezzard Standing  )  ADvise her also to keep her appt with Dr. Alberteen Sam ------

## 2013-05-20 ENCOUNTER — Ambulatory Visit (INDEPENDENT_AMBULATORY_CARE_PROVIDER_SITE_OTHER): Payer: 59 | Admitting: Family Medicine

## 2013-05-20 ENCOUNTER — Encounter: Payer: Self-pay | Admitting: Family Medicine

## 2013-05-20 ENCOUNTER — Other Ambulatory Visit: Payer: Self-pay | Admitting: *Deleted

## 2013-05-20 VITALS — BP 133/100 | HR 71 | Resp 16 | Wt 212.0 lb

## 2013-05-20 DIAGNOSIS — I10 Essential (primary) hypertension: Secondary | ICD-10-CM

## 2013-05-20 DIAGNOSIS — R110 Nausea: Secondary | ICD-10-CM

## 2013-05-20 DIAGNOSIS — R1031 Right lower quadrant pain: Secondary | ICD-10-CM

## 2013-05-20 DIAGNOSIS — N760 Acute vaginitis: Secondary | ICD-10-CM

## 2013-05-20 LAB — POCT WET PREP (WET MOUNT)

## 2013-05-20 MED ORDER — ONDANSETRON HCL 4 MG PO TABS: 4.0000 mg | ORAL_TABLET | Freq: Three times a day (TID) | ORAL | Status: DC | PRN

## 2013-05-20 MED ORDER — METRONIDAZOLE 500 MG PO TABS: ORAL_TABLET | ORAL | Status: AC

## 2013-05-20 NOTE — Telephone Encounter (Signed)
Refill request

## 2013-05-20 NOTE — Assessment & Plan Note (Signed)
She had a significant amount of discharge.  We'll treat her with Flagyl 500 mg twice a day for a week to see if this helps her symptoms at all.

## 2013-05-20 NOTE — Assessment & Plan Note (Addendum)
Leah Moore's presentation is not very worrisome for appendicitis at this point although she says that her symptoms have increased slightly since Monday.  She was encouraged to take a stool softener and increase her fiber to help with her constipation.  I called and spoke with Dr. Ezzard Standing to see if he had any recommendations for what more should be done for her at this point.  I told him about the BV and he said to definitely go ahead and treat this.  I asked if he would add any other antibiotic and he said no.  She was instructed to take her temperature if she worsens/feels warm.  If she does spike a fever, we'll recheck a WBC count and see if surgery can see her sooner than Monday.  In speaking with Dr. Ezzard Standing, he noted that her gallbladder was not really addressed in the CT report.  He mentioned that an abdominal U/S might be needed in the future if further testing is warranted.  The patient is to let us know on Friday how she is feeling after being on the Flagyl for a couple days.

## 2013-05-20 NOTE — Patient Instructions (Addendum)
1)  Nausea - Take the Zofran as needed for nausea.    2)  Constipation - Increase water and fiber and add a stool softener if needed for your constipation.    3)  Abdominal Pain- Take the Flagyl as directed for 7 days.  Check your temperature and let us know if you spike a temp of >100.5.  We will recheck your WBC count if you spike a fever.  Follow up with [facility] on Monday.  4)  Vaginal Discharge - Your wet prep showed that you had a bacterial infection called Bacterial Vaginosis.  This will be treated with Flagyl.  Be sure and do not drink alcohol while you are on Flagyl.       Bacterial Vaginosis Bacterial vaginosis (BV) is a vaginal infection where the normal balance of bacteria in the vagina is disrupted. The normal balance is then replaced by an overgrowth of certain bacteria. There are several different kinds of bacteria that can cause BV. BV is the most common vaginal infection in women of childbearing age. CAUSES   The cause of BV is not fully understood. BV develops when there is an increase or imbalance of harmful bacteria.  Some activities or behaviors can upset the normal balance of bacteria in the vagina and put women at increased risk including:  Having a new sex partner or multiple sex partners.  Douching.  Using an intrauterine device (IUD) for contraception.  It is not clear what role sexual activity plays in the development of BV. However, women that have never had sexual intercourse are rarely infected with BV. Women do not get BV from toilet seats, bedding, swimming pools or from touching objects around them.  SYMPTOMS   Grey vaginal discharge.  A fish-like odor with discharge, especially after sexual intercourse.  Itching or burning of the vagina and vulva.  Burning or pain with urination.  Some women have no signs or symptoms at all. DIAGNOSIS  Your caregiver must examine the vagina for signs of BV. Your caregiver will perform lab tests  and look at the sample of vaginal fluid through a microscope. They will look for bacteria and abnormal cells (clue cells), a pH test higher than 4.5, and a positive amine test all associated with BV.  RISKS AND COMPLICATIONS   Pelvic inflammatory disease (PID).  Infections following gynecology surgery.  Developing HIV.  Developing herpes virus. TREATMENT  Sometimes BV will clear up without treatment. However, all women with symptoms of BV should be treated to avoid complications, especially if gynecology surgery is planned. Female partners generally do not need to be treated. However, BV may spread between female sex partners so treatment is helpful in preventing a recurrence of BV.   BV may be treated with antibiotics. The antibiotics come in either pill or vaginal cream forms. Either can be used with nonpregnant or pregnant women, but the recommended dosages differ. These antibiotics are not harmful to the baby.  BV can recur after treatment. If this happens, a second round of antibiotics will often be prescribed.  Treatment is important for pregnant women. If not treated, BV can cause a premature delivery, especially for a pregnant woman who had a premature birth in the past. All pregnant women who have symptoms of BV should be checked and treated.  For chronic reoccurrence of BV, treatment with a type of prescribed gel vaginally twice a week is helpful. HOME CARE INSTRUCTIONS   Finish all medication as directed by your caregiver.  Do not have sex until treatment is completed.  Tell your sexual partner that you have a vaginal infection. They should see their caregiver and be treated if they have problems, such as a mild rash or itching.  Practice safe sex. Use condoms. Only have 1 sex partner. PREVENTION  Basic prevention steps can help reduce the risk of upsetting the natural balance of bacteria in the vagina and developing BV:  Do not have sexual intercourse (be abstinent).  Do  not douche.  Use all of the medicine prescribed for treatment of BV, even if the signs and symptoms go away.  Tell your sex partner if you have BV. That way, they can be treated, if needed, to prevent reoccurrence. SEEK MEDICAL CARE IF:   Your symptoms are not improving after 3 days of treatment.  You have increased discharge, pain, or fever. MAKE SURE YOU:   Understand these instructions.  Will watch your condition.  Will get help right away if you are not doing well or get worse. FOR MORE INFORMATION  Division of STD Prevention (DSTDP), Centers for Disease Control and Prevention: [employer] American Social Health Association (ASHA): www.ashastd.org  Document Released: 07/23/2005 Document Revised: 10/15/2011 Document Reviewed: 01/13/2009 [facility] Patient Information 2014 [place], [place].

## 2013-05-20 NOTE — Progress Notes (Signed)
Subjective:    Patient ID: Leah Moore, female    DOB: 07/06/1966, 47 y.o.   MRN: [id number]  HPI             Ms. Hendler was in her normal state of good health until 05/09/2013.  On that day, she developed GI symptoms (nausea, abdominal cramping, bloating and pain, decreased appetite and frequent soft stools ).  She took Gas X for her symptoms and says that it did not provide any relief.  She continued to have these abdominal symptoms over the next week.  On Thursday (05/14/2013),  she started having pain in her upper right back under her scapula.  She denies having increased belching or RUQ pain after eating.  On Sunday (05/17/13) the pain worsened in her upper right back and right shoulder so she decided to seek medical care. She went to the [facility] on [address] and they recommended a CT scan to rule out appendicitis, gallstones and kidney stones.  She waited to be seen by her PCP (Dr. Constance Goltz) on Monday.  She had a normal U/A and EKG and a CT of the abdomen/pelvis was ordered.  The CT showed that her appendix was "slightly dilated" but there was no obvious sign of appendicitis.  A CBC was done which was normal and she was afebrile.  She was sent to the ED for further evaluation.  Dr. Ezzard Standing with [facility] ([facility]) was consulted over the phone and he recommended that she have close follow-up to note any changes in her presentation.  She is in today for that follow up.  Her original GI symptoms and the upper back/shoulder symptoms have improved slightly.  Her biggest GI complaint today is nausea and she feels that the pain in her RLQ is a little more prominent.  She has not had a bowel movement since having the CT and wonders if this is contributing to the feeling of fullness she is  experiencing.  She has remained afebrile since Monday.  She has an appointment scheduled with Dr. Luisa Hart at [facility] on Monday to discuss her options.     Review of Systems  Constitutional: Positive for appetite change and fatigue.  HENT: Negative.   Eyes: Negative.   Respiratory: Negative.   Cardiovascular: Negative.   Gastrointestinal: Positive for nausea, abdominal pain, constipation and abdominal distention.       Mild  Endocrine: Negative.   Genitourinary: Negative.   Musculoskeletal: Positive for back pain.       She describes her pain located in her right lower back as "stabbing pins."  She also has a dull pain in her right shoulder.  Skin: Negative.   Allergic/Immunologic: Negative.   Neurological: Negative.   Hematological: Negative.   Psychiatric/Behavioral: Negative.     Past Medical History  Diagnosis Date  . Hypertension   . Glaucoma   . Mitral valve prolapse   . Vestibular vertigo     Family History  Problem Relation Age of Onset  . Arthritis Mother   . Hypertension Mother   . Diabetes Mother   . Glaucoma Mother   . COPD Mother   . Hypertension Father   . Stroke Father   . Hypertension Brother   . Glaucoma Brother   . Stroke Maternal Grandmother   . Heart disease Maternal Grandmother   . COPD Maternal Grandfather   . Heart disease Maternal Grandfather   . Cancer Maternal Grandfather     pancreatic  .  Cancer Paternal Grandmother   . Hypertension Paternal Grandmother   . Arthritis Paternal Grandmother   . Heart disease Paternal Grandfather     History   Social History  . Marital Status: Single    Spouse Name: N/A    Number of Children: N/A  . Years of Education: N/A   Occupational History  . Not on file.   Social History Main Topics  . Smoking status: Never Smoker   . Smokeless tobacco: Never Used  . Alcohol Use: Yes     Comment: socially  . Drug Use: No  . Sexual Activity: Yes    Birth Control/ Protection: Condom    Other Topics Concern  . Not on file   Social History Narrative  . No narrative on file       Objective:   Physical Exam  Vitals reviewed. Constitutional: She appears well-developed and well-nourished.  Eyes: No scleral icterus.  Neck: Neck supple. No thyromegaly present.  Cardiovascular: Normal rate and regular rhythm.   Pulmonary/Chest: Effort normal and breath sounds normal. No respiratory distress. She has no wheezes. She exhibits no tenderness.  Abdominal: Soft. Bowel sounds are normal. She exhibits no distension and no mass. There is tenderness (Mild ). There is rebound (Mild). There is no guarding. Hernia confirmed negative in the right inguinal area and confirmed negative in the left inguinal area.  Genitourinary: Uterus normal. Rectal exam shows no external hemorrhoid.    There is no lesion on the right labia. There is no lesion on the left labia. Cervix exhibits motion tenderness (She felt pain on her right side.  ). Right adnexum displays no mass, no tenderness and no fullness. Left adnexum displays no mass, no tenderness and no fullness. No erythema or bleeding around the vagina. Vaginal discharge found.  Lymphadenopathy:    She has no cervical adenopathy.       Right: No inguinal adenopathy present.       Left: No inguinal adenopathy present.      Assessment & Plan:

## 2013-05-20 NOTE — Assessment & Plan Note (Signed)
She was given a prescription for Zofran.

## 2013-05-20 NOTE — Assessment & Plan Note (Signed)
Her BP is elevated at today's visit.

## 2013-05-21 ENCOUNTER — Other Ambulatory Visit: Payer: Self-pay | Admitting: *Deleted

## 2013-05-23 MED ORDER — CLONIDINE HCL 0.1 MG PO TABS: ORAL_TABLET | ORAL | Status: DC

## 2013-05-25 ENCOUNTER — Ambulatory Visit (INDEPENDENT_AMBULATORY_CARE_PROVIDER_SITE_OTHER): Payer: 59 | Admitting: Surgery

## 2013-05-25 ENCOUNTER — Encounter: Payer: Self-pay | Admitting: Internal Medicine

## 2013-05-25 ENCOUNTER — Other Ambulatory Visit: Payer: Self-pay | Admitting: *Deleted

## 2013-05-25 ENCOUNTER — Telehealth: Payer: Self-pay | Admitting: *Deleted

## 2013-05-25 ENCOUNTER — Ambulatory Visit (HOSPITAL_BASED_OUTPATIENT_CLINIC_OR_DEPARTMENT_OTHER)
Admission: RE | Admit: 2013-05-25 | Discharge: 2013-05-25 | Disposition: A | Discharge status: Home / Self Care | Payer: 59 | Source: Ambulatory Visit | Attending: Internal Medicine | Admitting: Internal Medicine

## 2013-05-25 DIAGNOSIS — R109 Unspecified abdominal pain: Secondary | ICD-10-CM

## 2013-05-25 DIAGNOSIS — K5900 Constipation, unspecified: Secondary | ICD-10-CM | POA: Insufficient documentation

## 2013-05-25 DIAGNOSIS — R1011 Right upper quadrant pain: Secondary | ICD-10-CM | POA: Insufficient documentation

## 2013-05-25 NOTE — Telephone Encounter (Signed)
LVM message for pt to return call  

## 2013-05-25 NOTE — Telephone Encounter (Signed)
Will come in for [place] this Pm at 9:00

## 2013-05-26 ENCOUNTER — Telehealth: Payer: Self-pay | Admitting: *Deleted

## 2013-05-26 NOTE — Telephone Encounter (Signed)
Leah Moore called for her results. I let her know that her US showed no gallstones, and that she needed to keep her appointment with surgeon on Friday. She voiced understanding.

## 2013-05-26 NOTE — Telephone Encounter (Signed)
Message copied by Malena Catholic on Tue May 26, 2013 12:47 PM ------      Message from: Mathews Robinsons      Created: Tue May 26, 2013 12:44 PM                   ----- Message -----         From: Kendrick Ranch, MD         Sent: 05/26/2013   8:47 AM           To: Lysbeth Penner, RN            [name]            Call pt and let her know there are no gallstones in her gall bladder.  ADvise her to keep her appt at the surgeons office on Friday ------

## 2013-05-29 ENCOUNTER — Encounter (INDEPENDENT_AMBULATORY_CARE_PROVIDER_SITE_OTHER): Payer: Self-pay | Admitting: Surgery

## 2013-05-29 ENCOUNTER — Encounter: Payer: Self-pay | Admitting: Internal Medicine

## 2013-05-29 ENCOUNTER — Ambulatory Visit (INDEPENDENT_AMBULATORY_CARE_PROVIDER_SITE_OTHER): Payer: 59 | Admitting: Surgery

## 2013-05-29 ENCOUNTER — Telehealth (INDEPENDENT_AMBULATORY_CARE_PROVIDER_SITE_OTHER): Payer: Self-pay | Admitting: *Deleted

## 2013-05-29 VITALS — BP 142/100 | HR 80 | Temp 98.1°F | Resp 15 | Ht 69.0 in | Wt 214.4 lb

## 2013-05-29 DIAGNOSIS — G8929 Other chronic pain: Secondary | ICD-10-CM

## 2013-05-29 DIAGNOSIS — R1031 Right lower quadrant pain: Secondary | ICD-10-CM

## 2013-05-29 MED ORDER — TRAMADOL HCL 50 MG PO TABS: 50.0000 mg | ORAL_TABLET | Freq: Four times a day (QID) | ORAL | Status: DC | PRN

## 2013-05-29 NOTE — Telephone Encounter (Signed)
I called and spoke with pt to inform her of her appt for her HIDA scan at [facility] on 06/08/13 with an arrival time of 6:45am.  Instructed pt to be NPO after midnight and not to take any stomach meds that morning.  Also informed pt of her appt with Dr. Rhea Belton and [facility] on 06/22/13 with an arrival time of 2:15pm.  Instructed pt that they would send out new patient paperwork for her.  Pt agreeable to these appts.

## 2013-05-29 NOTE — Patient Instructions (Signed)
Take miralax twice a day.  Will refer to GI medicine for colonoscopy for abdominal pain and appendix dilation.  Tramadol every  6 hours for pain.  HIDA test to evaluate gallbldder. Drink plenty of water.  No ETOH.

## 2013-05-29 NOTE — Progress Notes (Signed)
Patient ID: Leah Moore, female   DOB: 1966-05-23, 47 y.o.   MRN: [id number]  Chief Complaint  Patient presents with  . New Evaluation    eval enlarged appen    HPI Leah Moore is a 47 y.o. female.  Patient sent at request of Dr. Raechel Chute 4 right-sided abdominal pain and constipation for 3-1/2 weeks. The patient relates 3 and half weeks ago she developed right lower quadrant and now right upper quadrant abdominal pain. It's mild to moderate. Constant. She describes it as a dull pain. Nothing makes it worse or better. She's had significant constipation issues with it. She may go 2 or 3 days without a bowel movement. No history of colonoscopy. No blood in her stool. No significant nodular vomiting. CT scan showed a mildly dilated appendix last week. Ultrasound showed no gallstones. No signs of bowel obstruction or hernia. No recent weight loss. HPI  Past Medical History  Diagnosis Date  . Hypertension   . Glaucoma   . Mitral valve prolapse   . Vestibular vertigo   . GERD (gastroesophageal reflux disease)     Past Surgical History  Procedure Laterality Date  . Knee arthroscopy      Family History  Problem Relation Age of Onset  . Arthritis Mother   . Hypertension Mother   . Diabetes Mother   . Glaucoma Mother   . COPD Mother   . Hypertension Father   . Stroke Father   . Hypertension Brother   . Glaucoma Brother   . Stroke Maternal Grandmother   . Heart disease Maternal Grandmother   . COPD Maternal Grandfather   . Heart disease Maternal Grandfather   . Cancer Maternal Grandfather     pancreatic  . Cancer Paternal Grandmother   . Hypertension Paternal Grandmother   . Arthritis Paternal Grandmother   . Heart disease Paternal Grandfather     Social History History  Substance Use Topics  . Smoking status: Never Smoker   . Smokeless tobacco: Never Used  . Alcohol Use: Yes     Comment: socially    Allergies  Allergen Reactions  . Lisinopril      Current Outpatient Prescriptions  Medication Sig Dispense Refill  . aspirin 81 MG tablet Take 81 mg by mouth daily.      . cetirizine (ZYRTEC) 10 MG tablet Take 10 mg by mouth daily.      . cloNIDine (CATAPRES) 0.1 MG tablet Take one tablet daily for 1 week then 1 tablet bid  60 tablet  1  . furosemide (LASIX) 20 MG tablet Take 1 tablet (20 mg total) by mouth daily.  30 tablet  3  . ibuprofen (ADVIL,MOTRIN) 200 MG tablet Take 400 mg by mouth every 6 (six) hours as needed for pain.      Marland Kitchen latanoprost (XALATAN) 0.005 % ophthalmic solution 1 drop at bedtime.      Marland Kitchen LORazepam (ATIVAN) 1 MG tablet Take one tablet two or three times a week for insomnia  20 tablet  1  . metoprolol tartrate (LOPRESSOR) 25 MG tablet 1/2 tablet twice a day  30 tablet  5  . ondansetron (ZOFRAN) 4 MG tablet Take 1 tablet (4 mg total) by mouth every 8 (eight) hours as needed for nausea.  30 tablet  1  . oxyCODONE-acetaminophen (PERCOCET/ROXICET) 5-325 MG per tablet Take 1 tablet by mouth every 6 (six) hours as needed for pain.  15 tablet  0  . potassium chloride SA (K-DUR,KLOR-CON) 20 MEQ  tablet Take 1 tablet (20 mEq total) by mouth daily.  30 tablet  3   No current facility-administered medications for this visit.    Review of Systems Review of Systems  Constitutional: Negative.   HENT: Negative.   Eyes: Negative.   Respiratory: Negative.   Gastrointestinal: Positive for abdominal pain. Negative for blood in stool.  Endocrine: Negative.   Genitourinary: Negative.   Musculoskeletal: Negative.   Skin: Negative.   Hematological: Negative.   Psychiatric/Behavioral: Negative.     Blood pressure 142/100, pulse 80, temperature 98.1 F (36.7 C), temperature source Temporal, resp. rate 15, height 5\' 9"  (1.753 m), weight 214 lb 6.4 oz (97.251 kg).  Physical Exam Physical Exam  Constitutional: She is oriented to person, place, and time. She appears well-developed and well-nourished.  HENT:  Head: Normocephalic  and atraumatic.  Eyes: Pupils are equal, round, and reactive to light. No scleral icterus.  Neck: Normal range of motion. Neck supple.  Cardiovascular: Normal rate and regular rhythm.   Pulmonary/Chest: Effort normal and breath sounds normal.  Abdominal: There is tenderness. There is no rigidity, no rebound, no guarding, no tenderness at McBurney's point and negative Murphy's sign.    Musculoskeletal: Normal range of motion.  Neurological: She is alert and oriented to person, place, and time.  Skin: Skin is warm and dry.  Psychiatric: She has a normal mood and affect. Her behavior is normal. Thought content normal.    Data Reviewed CT ABDOMEN AND PELVIS WITH CONTRAST  TECHNIQUE:  Multidetector CT imaging of the abdomen and pelvis was performed  using the standard protocol following bolus administration of  intravenous contrast.  CONTRAST: OMNIPAQUE IOHEXOL 300 MG/ML SOLN  COMPARISON: None.  FINDINGS:  There is slight a prominence of the appendix. It measures 8 mm in  diameter. There is no appreciable periappendiceal soft tissue  stranding.  The liver, biliary tree, spleen, pancreas, adrenal glands, and  kidneys are normal with exception of a tiny cyst in the anterior  aspect of the right lobe of the liver, 5 mm.  The terminal ileum is normal. No diverticular disease. Uterus and  ovaries are normal. No acute osseous abnormality. Moderate bilateral  facet arthritis at L5-S1.  IMPRESSION:  1. The appendix is slightly dilated but there is no periappendiceal  soft tissue stranding or other secondary signs of appendicitis.  2. Otherwise benign appearing abdomen. Is the patient's white count  elevated? Does the patient have a fever?  Electronically Signed  By: Geanie Cooley M.D.  On: 05/18/2013 13:54         Assessment    Abdominal pain right lower quadrant and right upper quadrant  Constipation    Plan    Doubt that patient has appendicitis. Her appendix is  minimally dilated and her symptoms have been over the course of 3-4 weeks. She has a normal white count. The constipation at this point needs to be worked up. Denies blood in her stool. Will refer to gastroenterology for consideration of colonoscopy in this setting of constipation and appendiceal dilation. We'll check HIDA to exclude dyskinesia. I have asked her to double up on her MiraLAX. We'll add tramadol for pain to keep her off narcotics. Drink plenty of water. Further treatment once above workup complete.       Yvonna Brun A. 05/29/2013, 11:24 AM

## 2013-05-31 NOTE — ED Provider Notes (Signed)
Medical screening examination/treatment/procedure(s) were performed by non-physician practitioner and as supervising physician I was immediately available for consultation/collaboration.  EKG Interpretation   None         Shanna Cisco, MD 05/31/13 2033

## 2013-06-08 ENCOUNTER — Encounter (HOSPITAL_COMMUNITY)
Admission: RE | Admit: 2013-06-08 | Discharge: 2013-06-08 | Disposition: A | Discharge status: Home / Self Care | Payer: 59 | Source: Ambulatory Visit | Attending: Surgery | Admitting: Surgery

## 2013-06-08 DIAGNOSIS — R1031 Right lower quadrant pain: Secondary | ICD-10-CM | POA: Insufficient documentation

## 2013-06-08 DIAGNOSIS — G8929 Other chronic pain: Secondary | ICD-10-CM

## 2013-06-08 MED ORDER — SINCALIDE 5 MCG IJ SOLR
INTRAMUSCULAR | Status: AC
  Administered 2013-06-08: 4.86 ug
  Filled 2013-06-08: qty 5

## 2013-06-08 MED ORDER — TECHNETIUM TC 99M MEBROFENIN IV KIT
5.0000 | PACK | Freq: Once | INTRAVENOUS | Status: AC | PRN
  Administered 2013-06-08: 5 via INTRAVENOUS

## 2013-06-08 MED ORDER — STERILE WATER FOR INJECTION IJ SOLN
INTRAMUSCULAR | Status: AC
  Administered 2013-06-08: 5 mL
  Filled 2013-06-08: qty 10

## 2013-06-10 ENCOUNTER — Encounter (INDEPENDENT_AMBULATORY_CARE_PROVIDER_SITE_OTHER): Payer: Self-pay | Admitting: Surgery

## 2013-06-11 ENCOUNTER — Encounter: Payer: Self-pay | Admitting: Internal Medicine

## 2013-06-11 ENCOUNTER — Telehealth: Payer: Self-pay | Admitting: *Deleted

## 2013-06-11 ENCOUNTER — Other Ambulatory Visit: Payer: Self-pay

## 2013-06-11 NOTE — Telephone Encounter (Signed)
After talking with RN at [facility] it was determined pt best chance of being seen and having surgery was to be seen in ED for her 10/10 pain. Advised pt that if she experiences 10/10 pain to be seen in ed

## 2013-06-11 NOTE — Telephone Encounter (Signed)
duplicate

## 2013-06-11 NOTE — Telephone Encounter (Signed)
[facility]  Call [facility] surgery and speak to clinical nurse to see if she can get into office sooner  Document conversation

## 2013-06-11 NOTE — Telephone Encounter (Signed)
Pt states that she needs her gall bladder out she has had this pain over a month and was sent an email telling her that she needed it removed but Dr Luisa Hart is out of town and can not do her surgery pt is requesting that we refer her to another surgeon that can remove her gall bladder ASAP

## 2013-06-15 ENCOUNTER — Other Ambulatory Visit (INDEPENDENT_AMBULATORY_CARE_PROVIDER_SITE_OTHER): Payer: Self-pay | Admitting: Surgery

## 2013-06-17 ENCOUNTER — Encounter (HOSPITAL_COMMUNITY): Payer: Self-pay | Admitting: Pharmacy Technician

## 2013-06-18 ENCOUNTER — Encounter: Payer: Self-pay | Admitting: Internal Medicine

## 2013-06-22 ENCOUNTER — Ambulatory Visit: Payer: 59 | Admitting: Internal Medicine

## 2013-06-24 ENCOUNTER — Encounter (HOSPITAL_COMMUNITY)
Admission: RE | Admit: 2013-06-24 | Discharge: 2013-06-24 | Disposition: A | Discharge status: Home / Self Care | Payer: 59 | Source: Ambulatory Visit | Attending: Surgery | Admitting: Surgery

## 2013-06-24 ENCOUNTER — Encounter (HOSPITAL_COMMUNITY): Payer: Self-pay

## 2013-06-24 ENCOUNTER — Ambulatory Visit (HOSPITAL_COMMUNITY)
Admission: RE | Admit: 2013-06-24 | Discharge: 2013-06-24 | Disposition: A | Discharge status: Home / Self Care | Payer: 59 | Source: Ambulatory Visit | Attending: Anesthesiology | Admitting: Anesthesiology

## 2013-06-24 DIAGNOSIS — I10 Essential (primary) hypertension: Secondary | ICD-10-CM | POA: Insufficient documentation

## 2013-06-24 HISTORY — DX: Unspecified osteoarthritis, unspecified site: M19.90

## 2013-06-24 HISTORY — DX: Headache: R51

## 2013-06-24 LAB — COMPREHENSIVE METABOLIC PANEL
ALT: 13 U/L (ref 0–35)
Alkaline Phosphatase: 100 U/L (ref 39–117)
CO2: 30 mEq/L (ref 19–32)
Chloride: 103 mEq/L (ref 96–112)
GFR calc Af Amer: 83 mL/min — ABNORMAL LOW (ref >90)
GFR calc non Af Amer: 72 mL/min — ABNORMAL LOW (ref >90)
Glucose, Bld: 99 mg/dL (ref 70–99)
Potassium: 4.2 mEq/L (ref 3.5–5.1)
Sodium: 143 mEq/L (ref 135–145)
Total Bilirubin: 0.4 mg/dL (ref 0.3–1.2)

## 2013-06-24 LAB — CBC WITH DIFFERENTIAL/PLATELET
Basophils Relative: 1 % (ref 0–1)
Eosinophils Relative: 4 % (ref 0–5)
Hemoglobin: 12.2 g/dL (ref 12.0–15.0)
Lymphocytes Relative: 45 % (ref 12–46)
Lymphs Abs: 1.9 10*3/uL (ref 0.7–4.0)
MCV: 89.3 fL (ref 78.0–100.0)
Neutro Abs: 2 10*3/uL (ref 1.7–7.7)
Platelets: 218 10*3/uL (ref 150–400)
RBC: 4.13 MIL/uL (ref 3.87–5.11)
RDW: 13.8 % (ref 11.5–15.5)
WBC: 4.3 10*3/uL (ref 4.0–10.5)

## 2013-06-24 NOTE — Pre-Procedure Instructions (Signed)
GIANNI FUCHS  06/24/2013   Your procedure is scheduled on: Tuesday, November 25th.  Report to [facility], Main Entrance/Entrance "A" at 10:25AM.  Call this number if you have problems the morning of surgery: [phone]   Remember:   Do not eat food or drink liquids after midnight.   Take these medicines the morning of surgery with A SIP OF WATER: Zyrtec, Clonidine, Metoprolol.pain med if needed,ativan if needed May take Zofran if needed. Stop taking Aspirin, Coumadin, Plavix, Effient and Herbal medications.  Do not take any NSAIDs ie: Ibuprofen,  Advil,Naproxen or any medication containing Aspirin.   Do not wear jewelry, make-up or nail polish.  Do not wear lotions, powders, or perfumes. You may wear deodorant.  Do not shave 48 hours prior to surgery.   Do not bring valuables to the hospital.  [facility] is not responsible for any belongings or valuables.               Contacts, dentures or bridgework may not be worn into surgery.  Leave suitcase in the car. After surgery it may be brought to your room.  For patients admitted to the hospital, discharge time is determined by your  treatment team.               Patients discharged the day of surgery will not be allowed to drive home.  Name and phone number of your driver: -   Special Instructions: Shower using CHG 2 nights before surgery and the night before surgery.  If you shower the day of surgery use CHG.  Use special wash - you have one bottle of CHG for all showers.  You should use approximately 1/3 of the bottle for each shower.   Please read over the following fact sheets that you were given: Pain Booklet, Coughing and Deep Breathing and Surgical Site Infection Prevention

## 2013-06-24 NOTE — Pre-Procedure Instructions (Signed)
Mirissa A Jarecki  06/24/2013   Your procedure is scheduled on: Tuesday, November 25th.  Report to [facility], Main Entrance/Entrance "A" at 10:25AM.  Call this number if you have problems the morning of surgery: [phone]   Remember:   Do not eat food or drink liquids after midnight.   Take these medicines the morning of surgery with A SIP OF WATER: Zyrtec, Clonidine, Metoprolol.pain med if needed,ativan if needed May take Zofran if needed. Stop taking Aspirin, Coumadin, Plavix, Effient and Herbal medications.  Do not take any NSAIDs ie: Ibuprofen,  Advil,Naproxen or any medication containing Aspirin.   Do not wear jewelry, make-up or nail polish.  Do not wear lotions, powders, or perfumes. You may wear deodorant.  Do not shave 48 hours prior to surgery.   Do not bring valuables to the hospital.  [facility] is not responsible for any belongings or valuables.               Contacts, dentures or bridgework may not be worn into surgery.  Leave suitcase in the car. After surgery it may be brought to your room.  For patients admitted to the hospital, discharge time is determined by your  treatment team.               Patients discharged the day of surgery will not be allowed to drive home.  Name and phone number of your driver: -   Special Instructions: Shower using CHG 2 nights before surgery and the night before surgery.  If you shower the day of surgery use CHG.  Use special wash - you have one bottle of CHG for all showers.  You should use approximately 1/3 of the bottle for each shower.   Please read over the following fact sheets that you were given: Pain Booklet, Coughing and Deep Breathing and Surgical Site Infection Prevention    

## 2013-06-29 MED ORDER — CHLORHEXIDINE GLUCONATE 4 % EX LIQD: 1.0000 "application " | Freq: Once | CUTANEOUS | Status: DC

## 2013-06-29 MED ORDER — DEXTROSE 5 % IV SOLN
3.0000 g | INTRAVENOUS | Status: AC
  Administered 2013-06-30: 3 g via INTRAVENOUS
  Filled 2013-06-29: qty 3000

## 2013-06-30 ENCOUNTER — Encounter (HOSPITAL_COMMUNITY)
Admission: RE | Disposition: A | Discharge status: Home / Self Care | Payer: Self-pay | Source: Ambulatory Visit | Attending: Surgery

## 2013-06-30 ENCOUNTER — Ambulatory Visit (HOSPITAL_COMMUNITY)
Admission: RE | Admit: 2013-06-30 | Discharge: 2013-06-30 | Disposition: A | Discharge status: Home / Self Care | Payer: 59 | Source: Ambulatory Visit | Attending: Surgery | Admitting: Surgery

## 2013-06-30 ENCOUNTER — Ambulatory Visit (HOSPITAL_COMMUNITY): Payer: 59 | Admitting: Anesthesiology

## 2013-06-30 ENCOUNTER — Encounter (HOSPITAL_COMMUNITY): Payer: Self-pay | Admitting: Anesthesiology

## 2013-06-30 ENCOUNTER — Encounter (HOSPITAL_COMMUNITY): Payer: 59 | Admitting: Anesthesiology

## 2013-06-30 DIAGNOSIS — K8010 Calculus of gallbladder with chronic cholecystitis without obstruction: Secondary | ICD-10-CM | POA: Insufficient documentation

## 2013-06-30 DIAGNOSIS — N760 Acute vaginitis: Secondary | ICD-10-CM

## 2013-06-30 DIAGNOSIS — H8120 Vestibular neuronitis, unspecified ear: Secondary | ICD-10-CM | POA: Insufficient documentation

## 2013-06-30 DIAGNOSIS — K219 Gastro-esophageal reflux disease without esophagitis: Secondary | ICD-10-CM | POA: Insufficient documentation

## 2013-06-30 DIAGNOSIS — Z780 Asymptomatic menopausal state: Secondary | ICD-10-CM

## 2013-06-30 DIAGNOSIS — K828 Other specified diseases of gallbladder: Secondary | ICD-10-CM | POA: Insufficient documentation

## 2013-06-30 DIAGNOSIS — I10 Essential (primary) hypertension: Secondary | ICD-10-CM

## 2013-06-30 DIAGNOSIS — R110 Nausea: Secondary | ICD-10-CM

## 2013-06-30 DIAGNOSIS — Z7982 Long term (current) use of aspirin: Secondary | ICD-10-CM | POA: Insufficient documentation

## 2013-06-30 DIAGNOSIS — E669 Obesity, unspecified: Secondary | ICD-10-CM

## 2013-06-30 DIAGNOSIS — R002 Palpitations: Secondary | ICD-10-CM

## 2013-06-30 DIAGNOSIS — R1031 Right lower quadrant pain: Secondary | ICD-10-CM

## 2013-06-30 DIAGNOSIS — F32A Depression, unspecified: Secondary | ICD-10-CM

## 2013-06-30 DIAGNOSIS — Z872 Personal history of diseases of the skin and subcutaneous tissue: Secondary | ICD-10-CM

## 2013-06-30 DIAGNOSIS — F329 Major depressive disorder, single episode, unspecified: Secondary | ICD-10-CM

## 2013-06-30 DIAGNOSIS — E559 Vitamin D deficiency, unspecified: Secondary | ICD-10-CM

## 2013-06-30 DIAGNOSIS — H409 Unspecified glaucoma: Secondary | ICD-10-CM | POA: Insufficient documentation

## 2013-06-30 DIAGNOSIS — I059 Rheumatic mitral valve disease, unspecified: Secondary | ICD-10-CM | POA: Insufficient documentation

## 2013-06-30 DIAGNOSIS — Z79899 Other long term (current) drug therapy: Secondary | ICD-10-CM | POA: Insufficient documentation

## 2013-06-30 HISTORY — PX: CHOLECYSTECTOMY: SHX55

## 2013-06-30 HISTORY — DX: Personal history of other medical treatment: Z92.89

## 2013-06-30 SURGERY — LAPAROSCOPIC CHOLECYSTECTOMY WITH INTRAOPERATIVE CHOLANGIOGRAM
Anesthesia: General | Wound class: Contaminated

## 2013-06-30 MED ORDER — ACETAMINOPHEN 325 MG PO TABS
650.0000 mg | ORAL_TABLET | ORAL | Status: DC | PRN
  Filled 2013-06-30: qty 2

## 2013-06-30 MED ORDER — SODIUM CHLORIDE 0.9 % IV SOLN: 250.0000 mL | INTRAVENOUS | Status: DC | PRN

## 2013-06-30 MED ORDER — LACTATED RINGERS IV SOLN
INTRAVENOUS | Status: DC
  Administered 2013-06-30: 11:00:00 via INTRAVENOUS

## 2013-06-30 MED ORDER — ONDANSETRON HCL 4 MG/2ML IJ SOLN
4.0000 mg | Freq: Four times a day (QID) | INTRAMUSCULAR | Status: DC | PRN
  Filled 2013-06-30: qty 2

## 2013-06-30 MED ORDER — OXYCODONE HCL 5 MG PO TABS
5.0000 mg | ORAL_TABLET | ORAL | Status: DC | PRN
  Administered 2013-06-30: 5 mg via ORAL
  Filled 2013-06-30: qty 1

## 2013-06-30 MED ORDER — ACETAMINOPHEN 650 MG RE SUPP
650.0000 mg | RECTAL | Status: DC | PRN
  Filled 2013-06-30: qty 1

## 2013-06-30 MED ORDER — KETOROLAC TROMETHAMINE 30 MG/ML IJ SOLN
INTRAMUSCULAR | Status: AC
  Filled 2013-06-30: qty 1

## 2013-06-30 MED ORDER — HYDROMORPHONE HCL PF 1 MG/ML IJ SOLN
INTRAMUSCULAR | Status: AC
  Filled 2013-06-30: qty 1

## 2013-06-30 MED ORDER — ONDANSETRON HCL 4 MG/2ML IJ SOLN: 4.0000 mg | Freq: Once | INTRAMUSCULAR | Status: DC | PRN

## 2013-06-30 MED ORDER — MIDAZOLAM HCL 5 MG/5ML IJ SOLN
INTRAMUSCULAR | Status: DC | PRN
  Administered 2013-06-30: 2 mg via INTRAVENOUS

## 2013-06-30 MED ORDER — NEOSTIGMINE METHYLSULFATE 1 MG/ML IJ SOLN
INTRAMUSCULAR | Status: DC | PRN
  Administered 2013-06-30: 3 mg via INTRAVENOUS

## 2013-06-30 MED ORDER — SODIUM CHLORIDE 0.9 % IR SOLN
Status: DC | PRN
  Administered 2013-06-30: 2000 mL

## 2013-06-30 MED ORDER — LACTATED RINGERS IV SOLN
INTRAVENOUS | Status: DC | PRN
  Administered 2013-06-30 (×2): via INTRAVENOUS

## 2013-06-30 MED ORDER — ROCURONIUM BROMIDE 100 MG/10ML IV SOLN
INTRAVENOUS | Status: DC | PRN
  Administered 2013-06-30: 50 mg via INTRAVENOUS
  Administered 2013-06-30: 20 mg via INTRAVENOUS

## 2013-06-30 MED ORDER — LIDOCAINE HCL (CARDIAC) 20 MG/ML IV SOLN
INTRAVENOUS | Status: DC | PRN
  Administered 2013-06-30: 40 mg via INTRAVENOUS

## 2013-06-30 MED ORDER — ONDANSETRON HCL 4 MG/2ML IJ SOLN
INTRAMUSCULAR | Status: DC | PRN
  Administered 2013-06-30: 4 mg via INTRAVENOUS

## 2013-06-30 MED ORDER — KETOROLAC TROMETHAMINE 30 MG/ML IJ SOLN
15.0000 mg | Freq: Once | INTRAMUSCULAR | Status: AC | PRN
  Administered 2013-06-30: 30 mg via INTRAVENOUS

## 2013-06-30 MED ORDER — BUPIVACAINE-EPINEPHRINE 0.25% -1:200000 IJ SOLN
INTRAMUSCULAR | Status: DC | PRN
  Administered 2013-06-30: 14 mL

## 2013-06-30 MED ORDER — FENTANYL CITRATE 0.05 MG/ML IJ SOLN
INTRAMUSCULAR | Status: DC | PRN
  Administered 2013-06-30 (×2): 50 ug via INTRAVENOUS
  Administered 2013-06-30: 100 ug via INTRAVENOUS

## 2013-06-30 MED ORDER — KETOROLAC TROMETHAMINE 30 MG/ML IJ SOLN
INTRAMUSCULAR | Status: DC | PRN
  Administered 2013-06-30: 30 mg via INTRAVENOUS

## 2013-06-30 MED ORDER — DEXAMETHASONE SODIUM PHOSPHATE 4 MG/ML IJ SOLN
INTRAMUSCULAR | Status: DC | PRN
  Administered 2013-06-30: 8 mg via INTRAVENOUS

## 2013-06-30 MED ORDER — FENTANYL CITRATE 0.05 MG/ML IJ SOLN: 25.0000 ug | INTRAMUSCULAR | Status: DC | PRN

## 2013-06-30 MED ORDER — OXYCODONE-ACETAMINOPHEN 5-325 MG PO TABS: 1.0000 | ORAL_TABLET | ORAL | Status: DC | PRN

## 2013-06-30 MED ORDER — 0.9 % SODIUM CHLORIDE (POUR BTL) OPTIME
TOPICAL | Status: DC | PRN
  Administered 2013-06-30: 1000 mL

## 2013-06-30 MED ORDER — KETOROLAC TROMETHAMINE 15 MG/ML IJ SOLN
15.0000 mg | Freq: Four times a day (QID) | INTRAMUSCULAR | Status: DC
  Filled 2013-06-30 (×3): qty 1

## 2013-06-30 MED ORDER — BUPIVACAINE-EPINEPHRINE (PF) 0.25% -1:200000 IJ SOLN
INTRAMUSCULAR | Status: AC
  Filled 2013-06-30: qty 30

## 2013-06-30 MED ORDER — GLYCOPYRROLATE 0.2 MG/ML IJ SOLN
INTRAMUSCULAR | Status: DC | PRN
  Administered 2013-06-30: 0.3 mg via INTRAVENOUS

## 2013-06-30 MED ORDER — PROPOFOL 10 MG/ML IV BOLUS
INTRAVENOUS | Status: DC | PRN
  Administered 2013-06-30: 180 mg via INTRAVENOUS

## 2013-06-30 MED ORDER — HYDROMORPHONE HCL PF 1 MG/ML IJ SOLN
0.2500 mg | INTRAMUSCULAR | Status: DC | PRN
  Administered 2013-06-30 (×4): 0.5 mg via INTRAVENOUS

## 2013-06-30 MED ORDER — SODIUM CHLORIDE 0.9 % IJ SOLN: 3.0000 mL | Freq: Two times a day (BID) | INTRAMUSCULAR | Status: DC

## 2013-06-30 MED ORDER — SODIUM CHLORIDE 0.9 % IJ SOLN: 3.0000 mL | INTRAMUSCULAR | Status: DC | PRN

## 2013-06-30 SURGICAL SUPPLY — 46 items
ADH SKN CLS APL DERMABOND .7 (GAUZE/BANDAGES/DRESSINGS) ×1
APPLIER CLIP ROT 10 11.4 M/L (STAPLE) ×2
APR CLP MED LRG 11.4X10 (STAPLE) ×1
BAG SPEC RTRVL LRG 6X4 10 (ENDOMECHANICALS) ×1
BLADE SURG ROTATE 9660 (MISCELLANEOUS) IMPLANT
CANISTER SUCTION 2500CC (MISCELLANEOUS) ×2 IMPLANT
CHLORAPREP W/TINT 26ML (MISCELLANEOUS) ×2 IMPLANT
CLIP APPLIE ROT 10 11.4 M/L (STAPLE) ×1 IMPLANT
COVER MAYO STAND STRL (DRAPES) ×2 IMPLANT
COVER SURGICAL LIGHT HANDLE (MISCELLANEOUS) ×2 IMPLANT
DECANTER SPIKE VIAL GLASS SM (MISCELLANEOUS) ×4 IMPLANT
DERMABOND ADVANCED (GAUZE/BANDAGES/DRESSINGS) ×1
DERMABOND ADVANCED .7 DNX12 (GAUZE/BANDAGES/DRESSINGS) ×1 IMPLANT
DRAPE C-ARM 42X72 X-RAY (DRAPES) ×2 IMPLANT
DRAPE UTILITY 15X26 W/TAPE STR (DRAPE) ×4 IMPLANT
DRAPE WARM FLUID 44X44 (DRAPE) ×2 IMPLANT
ELECT REM PT RETURN 9FT ADLT (ELECTROSURGICAL) ×2
ELECTRODE REM PT RTRN 9FT ADLT (ELECTROSURGICAL) ×1 IMPLANT
GLOVE BIO SURGEON STRL SZ8 (GLOVE) ×2 IMPLANT
GLOVE BIOGEL PI IND STRL 7.0 (GLOVE) ×2 IMPLANT
GLOVE BIOGEL PI IND STRL 8 (GLOVE) ×1 IMPLANT
GLOVE BIOGEL PI INDICATOR 7.0 (GLOVE) ×2
GLOVE BIOGEL PI INDICATOR 8 (GLOVE) ×1
GLOVE SS BIOGEL STRL SZ 6.5 (GLOVE) IMPLANT
GLOVE SUPERSENSE BIOGEL SZ 6.5 (GLOVE) ×1
GLOVE SURG SS PI 7.0 STRL IVOR (GLOVE) ×1 IMPLANT
GLOVE SURG SS PI 7.5 STRL IVOR (GLOVE) ×1 IMPLANT
GOWN STRL NON-REIN LRG LVL3 (GOWN DISPOSABLE) ×6 IMPLANT
GOWN STRL REIN XL XLG (GOWN DISPOSABLE) ×2 IMPLANT
KIT BASIN OR (CUSTOM PROCEDURE TRAY) ×2 IMPLANT
KIT ROOM TURNOVER OR (KITS) ×2 IMPLANT
NS IRRIG 1000ML POUR BTL (IV SOLUTION) ×4 IMPLANT
PAD ARMBOARD 7.5X6 YLW CONV (MISCELLANEOUS) ×2 IMPLANT
POUCH SPECIMEN RETRIEVAL 10MM (ENDOMECHANICALS) ×2 IMPLANT
SCISSORS LAP 5X35 DISP (ENDOMECHANICALS) ×2 IMPLANT
SET CHOLANGIOGRAPH 5 50 .035 (SET/KITS/TRAYS/PACK) ×2 IMPLANT
SET IRRIG TUBING LAPAROSCOPIC (IRRIGATION / IRRIGATOR) ×2 IMPLANT
SLEEVE ENDOPATH XCEL 5M (ENDOMECHANICALS) ×2 IMPLANT
SPECIMEN JAR SMALL (MISCELLANEOUS) ×2 IMPLANT
SUT MNCRL AB 4-0 PS2 18 (SUTURE) ×2 IMPLANT
TOWEL OR 17X24 6PK STRL BLUE (TOWEL DISPOSABLE) ×2 IMPLANT
TOWEL OR 17X26 10 PK STRL BLUE (TOWEL DISPOSABLE) ×2 IMPLANT
TRAY LAPAROSCOPIC (CUSTOM PROCEDURE TRAY) ×2 IMPLANT
TROCAR XCEL BLUNT TIP 100MML (ENDOMECHANICALS) ×2 IMPLANT
TROCAR XCEL NON-BLD 11X100MML (ENDOMECHANICALS) ×2 IMPLANT
TROCAR XCEL NON-BLD 5MMX100MML (ENDOMECHANICALS) ×2 IMPLANT

## 2013-06-30 NOTE — Anesthesia Procedure Notes (Signed)
Procedure Name: Intubation Date/Time: 06/30/2013 12:13 PM Performed by: Whitman Hero Pre-anesthesia Checklist: Patient identified, Emergency Drugs available, Suction available and Patient being monitored Patient Re-evaluated:Patient Re-evaluated prior to inductionOxygen Delivery Method: Circle system utilized Preoxygenation: Pre-oxygenation with 100% oxygen Intubation Type: IV induction Ventilation: Mask ventilation without difficulty and Oral airway inserted - appropriate to patient size Laryngoscope Size: Mac and 3 Grade View: Grade I Tube type: Oral Tube size: 7.5 mm Number of attempts: 1 Airway Equipment and Method: Stylet Placement Confirmation: breath sounds checked- equal and bilateral,  positive ETCO2 and ETT inserted through vocal cords under direct vision Secured at: 21 cm Tube secured with: Tape Dental Injury: Teeth and Oropharynx as per pre-operative assessment

## 2013-06-30 NOTE — H&P (Signed)
Leah Moore  05/29/2013 9:40 AM   Office Visit  MRN:  [id number]   Description: 47 year old female  Provider: Clovis Pu. Florance Paolillo, MD  Department: [facility]        Diagnoses    Abdominal pain, chronic, right lower quadrant    -  Primary    789.03,  338.29      Reason for Visit    New Evaluation    eval enlarged appen        Current Vitals - Last Recorded    BP Pulse Temp(Src) Resp Ht Wt    142/100 80 98.1 F (36.7 C) (Temporal) 15 5\' 9"  (1.753 m) 214 lb 6.4 oz (97.251 kg)       BMI              31.65 kg/m2                 Vitals History Recorded       Progress Notes    Yuritza Paulhus A. Virjean Boman, MD at 05/29/2013 11:24 AM    Status: Signed            Patient ID: Leah Moore, female   DOB: 1965-11-26, 47 y.o.   MRN: [id number]    Chief Complaint   Patient presents with   .  New Evaluation       eval enlarged appen        HPI Leah Moore is a 47 y.o. female.  Patient sent at request of Dr. Raechel Chute 4 right-sided abdominal pain and constipation for 3-1/2 weeks. The patient relates 3 and half weeks ago she developed right lower quadrant and now right upper quadrant abdominal pain. It's mild to moderate. Constant. She describes it as a dull pain. Nothing makes it worse or better. She's had significant constipation issues with it. She may go 2 or 3 days without a bowel movement. No history of colonoscopy. No blood in her stool. No significant nodular vomiting. CT scan showed a mildly dilated appendix last week. Ultrasound showed no gallstones. No signs of bowel obstruction or hernia. No recent weight loss. HIDA SHOWS LOW EF HPI    Past Medical History   Diagnosis  Date   .  Hypertension     .  Glaucoma     .  Mitral valve prolapse     .  Vestibular vertigo     .  GERD (gastroesophageal reflux disease)           Past Surgical History   Procedure  Laterality  Date   .  Knee arthroscopy             Family History   Problem  Relation   Age of Onset   .  Arthritis  Mother     .  Hypertension  Mother     .  Diabetes  Mother     .  Glaucoma  Mother     .  COPD  Mother     .  Hypertension  Father     .  Stroke  Father     .  Hypertension  Brother     .  Glaucoma  Brother     .  Stroke  Maternal Grandmother     .  Heart disease  Maternal Grandmother     .  COPD  Maternal Grandfather     .  Heart disease  Maternal Grandfather     .  Cancer  Maternal Grandfather         pancreatic   .  Cancer  Paternal Grandmother     .  Hypertension  Paternal Grandmother     .  Arthritis  Paternal Grandmother     .  Heart disease  Paternal Grandfather          Social History History   Substance Use Topics   .  Smoking status:  Never Smoker    .  Smokeless tobacco:  Never Used   .  Alcohol Use:  Yes         Comment: socially         Allergies   Allergen  Reactions   .  Lisinopril           Current Outpatient Prescriptions   Medication  Sig  Dispense  Refill   .  aspirin 81 MG tablet  Take 81 mg by mouth daily.         .  cetirizine (ZYRTEC) 10 MG tablet  Take 10 mg by mouth daily.         .  cloNIDine (CATAPRES) 0.1 MG tablet  Take one tablet daily for 1 week then 1 tablet bid   60 tablet   1   .  furosemide (LASIX) 20 MG tablet  Take 1 tablet (20 mg total) by mouth daily.   30 tablet   3   .  ibuprofen (ADVIL,MOTRIN) 200 MG tablet  Take 400 mg by mouth every 6 (six) hours as needed for pain.         Marland Kitchen  latanoprost (XALATAN) 0.005 % ophthalmic solution  1 drop at bedtime.         Marland Kitchen  LORazepam (ATIVAN) 1 MG tablet  Take one tablet two or three times a week for insomnia   20 tablet   1   .  metoprolol tartrate (LOPRESSOR) 25 MG tablet  1/2 tablet twice a day   30 tablet   5   .  ondansetron (ZOFRAN) 4 MG tablet  Take 1 tablet (4 mg total) by mouth every 8 (eight) hours as needed for nausea.   30 tablet   1   .  oxyCODONE-acetaminophen (PERCOCET/ROXICET) 5-325 MG per tablet  Take 1 tablet by mouth every 6 (six) hours as  needed for pain.   15 tablet   0   .  potassium chloride SA (K-DUR,KLOR-CON) 20 MEQ tablet  Take 1 tablet (20 mEq total) by mouth daily.   30 tablet   3       No current facility-administered medications for this visit.        Review of Systems Review of Systems  Constitutional: Negative.   HENT: Negative.   Eyes: Negative.   Respiratory: Negative.   Gastrointestinal: Positive for abdominal pain. Negative for blood in stool.  Endocrine: Negative.   Genitourinary: Negative.   Musculoskeletal: Negative.   Skin: Negative.   Hematological: Negative.   Psychiatric/Behavioral: Negative.       Blood pressure 142/100, pulse 80, temperature 98.1 F (36.7 C), temperature source Temporal, resp. rate 15, height 5\' 9"  (1.753 m), weight 214 lb 6.4 oz (97.251 kg).   Physical Exam Physical Exam  Constitutional: She is oriented to person, place, and time. She appears well-developed and well-nourished.  HENT:   Head: Normocephalic and atraumatic.  Eyes: Pupils are equal, round, and reactive to light. No scleral icterus.  Neck: Normal range of motion. Neck supple.  Cardiovascular:  Normal rate and regular rhythm.   Pulmonary/Chest: Effort normal and breath sounds normal.  Abdominal: There is tenderness. There is no rigidity, no rebound, no guarding, no tenderness at McBurney's point and negative Murphy's sign.    Musculoskeletal: Normal range of motion.  Neurological: She is alert and oriented to person, place, and time.  Skin: Skin is warm and dry.  Psychiatric: She has a normal mood and affect. Her behavior is normal. Thought content normal.      Data Reviewed CT ABDOMEN AND PELVIS WITH CONTRAST   TECHNIQUE:   Multidetector CT imaging of the abdomen and pelvis was performed   using the standard protocol following bolus administration of   intravenous contrast.   CONTRAST: OMNIPAQUE IOHEXOL 300 MG/ML SOLN   COMPARISON: None.   FINDINGS:   There is slight a prominence of  the appendix. It measures 8 mm in   diameter. There is no appreciable periappendiceal soft tissue   stranding.   The liver, biliary tree, spleen, pancreas, adrenal glands, and   kidneys are normal with exception of a tiny cyst in the anterior   aspect of the right lobe of the liver, 5 mm.   The terminal ileum is normal. No diverticular disease. Uterus and   ovaries are normal. No acute osseous abnormality. Moderate bilateral   facet arthritis at L5-S1.   IMPRESSION:   1. The appendix is slightly dilated but there is no periappendiceal   soft tissue stranding or other secondary signs of appendicitis.   2. Otherwise benign appearing abdomen. Is the patient's white count   elevated? Does the patient have a fever?   Electronically Signed   By: Geanie Cooley M.D.   On: 05/18/2013 13:54       HIDA EF 23 %           Assessment    Biliary dyskinesia  constipation    Plan    HIDA shows low EF of 23 %.  Likely hood of surgery relieving pain is 50 %.  Pt is aware and wishes to proceed.  If no resolution of pain ,  Will need further GI workup.  The procedure has been discussed with the patient. Operative and non operative treatments have been discussed. Risks of surgery include bleeding, infection,  Common bile duct injury,  Injury to the stomach,liver, colon,small intestine, abdominal wall,  Diaphragm,  Major blood vessels,  And the need for an open procedure.  Other risks include worsening of medical problems, death,  DVT and pulmonary embolism, and cardiovascular events.   Medical options have also been discussed. The patient has been informed of long term expectations of surgery and non surgical options,  The patient agrees to proceed.           Briyonna Omara A. 05/29/2013, 11:24 AM                    Not recorded     Medications Ordered This Encounter      Disp Refills Start End    traMADol (ULTRAM) 50 MG tablet (Discontinued) 20 tablet 0 05/29/2013 06/17/2013     Take 1 tablet (50 mg total) by mouth every 6 (six) hours as needed for pain. - Oral       Discontinued Medications      Reason for Discontinue    famotidine (PEPCID) 40 MG tablet Error       Orders Placed This Encounter    Ambulatory referral to Gastroenterology [ZOX09 Custom]  Future Labs/Procedures    NM Hepato W/EjeCT Fract [ZOX0960 Custom]    Expected by: As directed    Expires: 07/29/2014         Patient Instructions    Take miralax twice a day.  Will refer to GI medicine for colonoscopy for abdominal pain and appendix dilation.  Tramadol every  6 hours for pain.  HIDA test to evaluate gallbldder. Drink plenty of water.  No ETOH.         Level of Service    PR OFFICE CONSULTATION NEW/ESTAB PATIENT 40 MIN [99243]      Follow-up and Disposition    Routing History Recorded        All Flowsheet Templates (all recorded)    Encounter Vitals Flowsheet    Custom Formula Data Flowsheet    Anthropometrics Flowsheet          Letters    Letter Information      Status    Ople Girgis A. Zalen Sequeira on 05/29/2013 Sent           Referring Provider    Kendrick Ranch, MD       All Charges for This Encounter    Code Description Service Date Service Provider Modifiers Qty    [phone] PR OFFICE CONSULTATION NEW/ESTAB PATIENT 40 MIN 05/29/2013 Takeria Marquina A. Sohum Delillo, MD   1      Routing History    Recipient Method User Date Routed To    Kendrick Ranch, MD In [facility] A. Anjana Cheek, MD [[id number]] 05/29/2013 (none on file)      Letter: created on 05/29/2013 by Maisie Fus A. Maximillian Habibi, MD              Other Encounter Related Information    Allergies & Medications         Problem List         History         Patient-Entered Questionnaires   AVS Reports    Date/Time Report Action User    05/29/2013 10:32 AM After Visit Summary Printed Maisie Fus A. Brentin Shin, MD      Diabetic Foot Exam    No data filed      Diabetic Foot Form - Detailed    No data filed       Diabetic Foot Exam - Simple    No data filed

## 2013-06-30 NOTE — Transfer of Care (Signed)
Immediate Anesthesia Transfer of Care Note  Patient: Leah Moore  Procedure(s) Performed: Procedure(s): LAPAROSCOPIC CHOLECYSTECTOMY WITH INTRAOPERATIVE CHOLANGIOGRAM (N/A)  Patient Location: PACU  Anesthesia Type:General  Level of Consciousness: sedated and patient cooperative  Airway & Oxygen Therapy: Patient Spontanous Breathing and Patient connected to face mask oxygen  Post-op Assessment: Report given to PACU RN and Post -op Vital signs reviewed and stable  Post vital signs: Reviewed and stable  Complications: No apparent anesthesia complications

## 2013-06-30 NOTE — Preoperative (Signed)
Beta Blockers   Reason not to administer Beta Blockers:Not Applicable 

## 2013-06-30 NOTE — Anesthesia Postprocedure Evaluation (Signed)
  Anesthesia Post-op Note  Patient: Leah Moore  Procedure(s) Performed: Procedure(s): LAPAROSCOPIC CHOLECYSTECTOMY WITH INTRAOPERATIVE CHOLANGIOGRAM (N/A)  Patient Location: PACU  Anesthesia Type:General  Level of Consciousness: awake  Airway and Oxygen Therapy: Patient Spontanous Breathing  Post-op Pain: mild  Post-op Assessment: Post-op Vital signs reviewed  Post-op Vital Signs: Reviewed  Complications: No apparent anesthesia complications

## 2013-06-30 NOTE — Interval H&P Note (Signed)
History and Physical Interval Note:  06/30/2013 11:28 AM  Leah Moore  has presented today for surgery, with the diagnosis of biliary dyskinesia   The various methods of treatment have been discussed with the patient and family. After consideration of risks, benefits and other options for treatment, the patient has consented to  Procedure(s): LAPAROSCOPIC CHOLECYSTECTOMY WITH INTRAOPERATIVE CHOLANGIOGRAM (N/A) as a surgical intervention .  The patient's history has been reviewed, patient examined, no change in status, stable for surgery.  I have reviewed the patient's chart and labs.  Questions were answered to the patient's satisfaction.     [name] A.

## 2013-06-30 NOTE — Anesthesia Preprocedure Evaluation (Signed)
Anesthesia Evaluation  Patient identified by MRN, date of birth, ID band Patient awake    Reviewed: Allergy & Precautions, H&P , NPO status , Patient's Chart, lab work & pertinent test results  Airway Mallampati: II TM Distance: >3 FB Neck ROM: Full    Dental  (+) Teeth Intact and Dental Advisory Given   Pulmonary  breath sounds clear to auscultation        Cardiovascular hypertension, Rhythm:Regular Rate:Normal     Neuro/Psych    GI/Hepatic   Endo/Other    Renal/GU      Musculoskeletal   Abdominal (+) + obese,   Peds  Hematology   Anesthesia Other Findings   Reproductive/Obstetrics                           Anesthesia Physical Anesthesia Plan  ASA: II  Anesthesia Plan: General   Post-op Pain Management:    Induction: Intravenous  Airway Management Planned: Oral ETT  Additional Equipment:   Intra-op Plan:   Post-operative Plan: Extubation in OR  Informed Consent: I have reviewed the patients History and Physical, chart, labs and discussed the procedure including the risks, benefits and alternatives for the proposed anesthesia with the patient or authorized representative who has indicated his/her understanding and acceptance.   Dental advisory given  Plan Discussed with: CRNA and Anesthesiologist  Anesthesia Plan Comments: (Symptomatic cholelithiasis Htn Obesity  Plan GA with oral ETT  Kipp Brood, MD)        Anesthesia Quick Evaluation

## 2013-06-30 NOTE — Op Note (Signed)
Laparoscopic Cholecystectomy  Indications: This patient presents with symptomatic gallbladder disease and will undergo laparoscopic cholecystectomy.The procedure has been discussed with the patient. Operative and non operative treatments have been discussed. Risks of surgery include bleeding, infection,  Common bile duct injury,  Injury to the stomach,liver, colon,small intestine, abdominal wall,  Diaphragm,  Major blood vessels,  And the need for an open procedure.  Other risks include worsening of medical problems, death,  DVT and pulmonary embolism, and cardiovascular events.   Medical options have also been discussed. The patient has been informed of long term expectations of surgery and non surgical options,  The patient agrees to proceed.    Pre-operative Diagnosis: Same  Post-operative Diagnosis: Biliary dyskinesia  Surgeon: Harriette Bouillon A.   Assistants: OR staff  Anesthesia: General endotracheal anesthesia and Local anesthesia 0.25.% bupivacaine, with epinephrine  ASA Class: 2  Procedure Details  The patient was seen again in the Holding Room. The risks, benefits, complications, treatment options, and expected outcomes were discussed with the patient. The possibilities of reaction to medication, pulmonary aspiration, perforation of viscus, bleeding, recurrent infection, finding a normal gallbladder, the need for additional procedures, failure to diagnose a condition, the possible need to convert to an open procedure, and creating a complication requiring transfusion or operation were discussed with the patient. The patient and/or family concurred with the proposed plan, giving informed consent. The site of surgery properly noted/marked. The patient was taken to Operating Room, identified as [name] and the procedure verified as Laparoscopic Cholecystectomy with Intraoperative Cholangiograms. A Time Out was held and the above information confirmed.  Prior to the induction of  general anesthesia, antibiotic prophylaxis was administered. General endotracheal anesthesia was then administered and tolerated well. After the induction, the abdomen was prepped in the usual sterile fashion. The patient was positioned in the supine position with the left arm comfortably tucked, along with some reverse Trendelenburg.  Local anesthetic agent was injected into the skin near the umbilicus and an incision made. The midline fascia was incised and the Hasson technique was used to introduce a 12 mm port under direct vision. It was secured with a figure of eight Vicryl suture placed in the usual fashion. Pneumoperitoneum was then created with CO2 and tolerated well without any adverse changes in the patient's vital signs. Additional trocars were introduced under direct vision with an 11 mm trocar in the epigastrium and 2 5 mm trocars in the right upper quadrant. All skin incisions were infiltrated with a local anesthetic agent before making the incision and placing the trocars.   The gallbladder was identified, the fundus grasped and retracted cephalad. Adhesions were lysed bluntly and with the electrocautery where indicated, taking care not to injure any adjacent organs or viscus. The infundibulum was grasped and retracted laterally, exposing the peritoneum overlying the triangle of Calot. This was then divided and exposed in a blunt fashion. The cystic duct was clearly identified and bluntly dissected circumferentially. The junctions of the gallbladder, cystic duct and common bile duct were clearly identified prior to the division of any linear structure.   The anatomy was clear and critical view obtained.  Cholangiogram was not performed.   The cystic duct was then  ligated with surgical clips  on the patient side and  clipped on the gallbladder side and divided. The cystic artery was identified, dissected free, ligated with clips and divided as well. Posterior cystic artery clipped and  divided.  The gallbladder was dissected from the liver bed in  retrograde fashion with the electrocautery. The gallbladder was removed via endocatch bag.. The liver bed was irrigated and inspected. Hemostasis was achieved with the electrocautery. Copious irrigation was utilized and was repeatedly aspirated until clear all particulate matter. Hemostasis was achieved with no signs  Of bleeding or bile leakage.  Pneumoperitoneum was completely reduced after viewing removal of the trocars under direct vision. The wound was thoroughly irrigated and the fascia was then closed with a figure of eight suture; the skin was then closed with 4 O  and a sterile dressing was applied.  Instrument, sponge, and needle counts were correct at closure and at the conclusion of the case.   Findings: Nl appearing gallbladder  Estimated Blood Loss: Minimal                 Total IV Fluids: 600 mL         Specimens: Gallbladder           Complications: None; patient tolerated the procedure well.         Disposition: PACU - hemodynamically stable.         Condition: stable

## 2013-07-01 ENCOUNTER — Encounter (HOSPITAL_COMMUNITY): Payer: Self-pay | Admitting: Surgery

## 2013-07-20 ENCOUNTER — Encounter (INDEPENDENT_AMBULATORY_CARE_PROVIDER_SITE_OTHER): Payer: Self-pay | Admitting: Surgery

## 2013-07-20 ENCOUNTER — Ambulatory Visit (INDEPENDENT_AMBULATORY_CARE_PROVIDER_SITE_OTHER): Payer: 59 | Admitting: Surgery

## 2013-07-20 VITALS — BP 146/90 | HR 64 | Temp 98.0°F | Resp 14 | Ht 69.0 in | Wt 208.8 lb

## 2013-07-20 DIAGNOSIS — R1013 Epigastric pain: Secondary | ICD-10-CM

## 2013-07-20 DIAGNOSIS — G8929 Other chronic pain: Secondary | ICD-10-CM

## 2013-07-20 NOTE — Progress Notes (Signed)
she is here for a postop visit following laparoscopic cholecystectomy.  Diet is being tolerated, bowels are moving.  No problems with incisions. Stiil has bloating ,  Epigastric discomfort and right shoulder discomfort.  Feels some improvement after surgery. Still sfeels bloating.   PE:  ABD:  Soft, incisions clean/dry/intact and solid. Gallbladder - MILD CHRONIC CHOLECYSTITIS. - CHOLELITHIASIS PRESENT. - NO TUMOR SEEN.  Assessment:  S/p lap chole for BD but path shows stones  Plan:  Lowfat diet recommended.  Activities as tolerated.  Return visit prn. Refer to GI medicine to evaluate abdominal complaints.  Overall,  I thinks she is better but may need endoscopy to further assess at some point.

## 2013-07-20 NOTE — Patient Instructions (Signed)
Will refer to GI medicine for further evaluation.

## 2013-07-21 ENCOUNTER — Telehealth (INDEPENDENT_AMBULATORY_CARE_PROVIDER_SITE_OTHER): Payer: Self-pay | Admitting: *Deleted

## 2013-07-21 NOTE — Telephone Encounter (Signed)
I spoke with pt to inform her of the appt for her to see Dr. Randa Evens at [facility] on 12/17 at 8:30am.  Pt was originally scheduled with Dr. Rhea Belton but [facility] first available appt is in mid-January and pt did not want to wait that long.  She requested going to another GI doctor since she has not yet been established with a GI doctor previously.  She is agreeable with this appt with Dr. Randa Evens.

## 2013-08-24 ENCOUNTER — Ambulatory Visit: Payer: 59 | Admitting: Internal Medicine

## 2014-01-21 ENCOUNTER — Telehealth: Payer: Self-pay | Admitting: *Deleted

## 2014-01-21 DIAGNOSIS — N938 Other specified abnormal uterine and vaginal bleeding: Secondary | ICD-10-CM

## 2014-01-21 NOTE — Telephone Encounter (Signed)
Leah IngYvette called she has not had a period in 4 years, and she started bleeding today.  She wants to know if she needs to be seen by you or a Gyn.  She does not currently have a Gyn.

## 2014-01-21 NOTE — Telephone Encounter (Signed)
Leah Moore   Call pt and let her know that she needs to see a GYN  Will refer to Dr. Wynona LunaMorris  Gyn  Post menopausal bleeding

## 2014-03-23 ENCOUNTER — Other Ambulatory Visit: Payer: Self-pay | Admitting: Obstetrics & Gynecology

## 2014-03-24 LAB — CYTOLOGY - PAP

## 2014-04-14 ENCOUNTER — Emergency Department (HOSPITAL_COMMUNITY): Payer: 59 | Admitting: Certified Registered"

## 2014-04-14 ENCOUNTER — Encounter (HOSPITAL_COMMUNITY): Payer: Self-pay | Admitting: Emergency Medicine

## 2014-04-14 ENCOUNTER — Emergency Department (HOSPITAL_COMMUNITY): Payer: 59

## 2014-04-14 ENCOUNTER — Inpatient Hospital Stay: Admit: 2014-04-14 | Payer: 59 | Admitting: General Surgery

## 2014-04-14 ENCOUNTER — Encounter (HOSPITAL_COMMUNITY)
Admission: EM | Disposition: A | Discharge status: Home / Self Care | Payer: Self-pay | Source: Home / Self Care | Attending: Emergency Medicine

## 2014-04-14 ENCOUNTER — Encounter (HOSPITAL_COMMUNITY): Payer: 59 | Admitting: Certified Registered"

## 2014-04-14 ENCOUNTER — Ambulatory Visit (HOSPITAL_COMMUNITY)
Admission: EM | Admit: 2014-04-14 | Discharge: 2014-04-15 | Disposition: A | Discharge status: Home / Self Care | Payer: 59 | Attending: General Surgery | Admitting: General Surgery

## 2014-04-14 DIAGNOSIS — K3580 Unspecified acute appendicitis: Secondary | ICD-10-CM | POA: Insufficient documentation

## 2014-04-14 DIAGNOSIS — F329 Major depressive disorder, single episode, unspecified: Secondary | ICD-10-CM | POA: Diagnosis not present

## 2014-04-14 DIAGNOSIS — M129 Arthropathy, unspecified: Secondary | ICD-10-CM | POA: Insufficient documentation

## 2014-04-14 DIAGNOSIS — Z6829 Body mass index (BMI) 29.0-29.9, adult: Secondary | ICD-10-CM | POA: Diagnosis not present

## 2014-04-14 DIAGNOSIS — K219 Gastro-esophageal reflux disease without esophagitis: Secondary | ICD-10-CM | POA: Insufficient documentation

## 2014-04-14 DIAGNOSIS — R002 Palpitations: Secondary | ICD-10-CM | POA: Diagnosis not present

## 2014-04-14 DIAGNOSIS — H409 Unspecified glaucoma: Secondary | ICD-10-CM | POA: Diagnosis not present

## 2014-04-14 DIAGNOSIS — E669 Obesity, unspecified: Secondary | ICD-10-CM | POA: Insufficient documentation

## 2014-04-14 DIAGNOSIS — I10 Essential (primary) hypertension: Secondary | ICD-10-CM | POA: Diagnosis not present

## 2014-04-14 DIAGNOSIS — E559 Vitamin D deficiency, unspecified: Secondary | ICD-10-CM | POA: Diagnosis not present

## 2014-04-14 DIAGNOSIS — F3289 Other specified depressive episodes: Secondary | ICD-10-CM | POA: Diagnosis not present

## 2014-04-14 DIAGNOSIS — K37 Unspecified appendicitis: Secondary | ICD-10-CM | POA: Diagnosis present

## 2014-04-14 DIAGNOSIS — N760 Acute vaginitis: Secondary | ICD-10-CM | POA: Insufficient documentation

## 2014-04-14 DIAGNOSIS — I059 Rheumatic mitral valve disease, unspecified: Secondary | ICD-10-CM | POA: Insufficient documentation

## 2014-04-14 DIAGNOSIS — R1031 Right lower quadrant pain: Secondary | ICD-10-CM | POA: Diagnosis present

## 2014-04-14 DIAGNOSIS — N9489 Other specified conditions associated with female genital organs and menstrual cycle: Secondary | ICD-10-CM | POA: Diagnosis not present

## 2014-04-14 HISTORY — PX: LAPAROSCOPIC APPENDECTOMY: SHX408

## 2014-04-14 LAB — COMPREHENSIVE METABOLIC PANEL
ALBUMIN: 4.3 g/dL (ref 3.5–5.2)
ALT: 18 U/L (ref 0–35)
AST: 18 U/L (ref 0–37)
Alkaline Phosphatase: 89 U/L (ref 39–117)
Anion gap: 11 (ref 5–15)
BUN: 9 mg/dL (ref 6–23)
CHLORIDE: 100 meq/L (ref 96–112)
CO2: 28 meq/L (ref 19–32)
CREATININE: 0.91 mg/dL (ref 0.50–1.10)
Calcium: 9.7 mg/dL (ref 8.4–10.5)
GFR calc Af Amer: 84 mL/min — ABNORMAL LOW (ref >90)
GFR, EST NON AFRICAN AMERICAN: 73 mL/min — AB (ref >90)
Glucose, Bld: 92 mg/dL (ref 70–99)
Potassium: 4 mEq/L (ref 3.7–5.3)
SODIUM: 139 meq/L (ref 137–147)
Total Bilirubin: 0.7 mg/dL (ref 0.3–1.2)
Total Protein: 8.1 g/dL (ref 6.0–8.3)

## 2014-04-14 LAB — CBC WITH DIFFERENTIAL/PLATELET
BASOS ABS: 0 10*3/uL (ref 0.0–0.1)
BASOS PCT: 0 % (ref 0–1)
Eosinophils Absolute: 0.2 10*3/uL (ref 0.0–0.7)
Eosinophils Relative: 4 % (ref 0–5)
HEMATOCRIT: 37.1 % (ref 36.0–46.0)
Hemoglobin: 12.2 g/dL (ref 12.0–15.0)
Lymphocytes Relative: 30 % (ref 12–46)
Lymphs Abs: 1.6 10*3/uL (ref 0.7–4.0)
MCH: 30.1 pg (ref 26.0–34.0)
MCHC: 32.9 g/dL (ref 30.0–36.0)
MCV: 91.6 fL (ref 78.0–100.0)
Monocytes Absolute: 0.3 10*3/uL (ref 0.1–1.0)
Monocytes Relative: 5 % (ref 3–12)
NEUTROS ABS: 3.3 10*3/uL (ref 1.7–7.7)
Neutrophils Relative %: 61 % (ref 43–77)
Platelets: 205 10*3/uL (ref 150–400)
RBC: 4.05 MIL/uL (ref 3.87–5.11)
RDW: 13.1 % (ref 11.5–15.5)
WBC: 5.4 10*3/uL (ref 4.0–10.5)

## 2014-04-14 LAB — URINALYSIS, ROUTINE W REFLEX MICROSCOPIC
GLUCOSE, UA: NEGATIVE mg/dL
HGB URINE DIPSTICK: NEGATIVE
Ketones, ur: NEGATIVE mg/dL
Nitrite: NEGATIVE
PH: 5.5 (ref 5.0–8.0)
PROTEIN: NEGATIVE mg/dL
Specific Gravity, Urine: 1.027 (ref 1.005–1.030)
Urobilinogen, UA: 1 mg/dL (ref 0.0–1.0)

## 2014-04-14 LAB — URINE MICROSCOPIC-ADD ON

## 2014-04-14 LAB — PREGNANCY, URINE: Preg Test, Ur: NEGATIVE

## 2014-04-14 SURGERY — APPENDECTOMY, LAPAROSCOPIC
Anesthesia: General | Site: Abdomen

## 2014-04-14 MED ORDER — FENTANYL CITRATE 0.05 MG/ML IJ SOLN: 25.0000 ug | INTRAMUSCULAR | Status: DC | PRN

## 2014-04-14 MED ORDER — LABETALOL HCL 5 MG/ML IV SOLN
INTRAVENOUS | Status: AC
  Filled 2014-04-14: qty 4

## 2014-04-14 MED ORDER — IBUPROFEN 200 MG PO TABS: 600.0000 mg | ORAL_TABLET | Freq: Four times a day (QID) | ORAL | Status: DC | PRN

## 2014-04-14 MED ORDER — MIDAZOLAM HCL 5 MG/5ML IJ SOLN
INTRAMUSCULAR | Status: DC | PRN
  Administered 2014-04-14: 2 mg via INTRAVENOUS

## 2014-04-14 MED ORDER — LABETALOL HCL 5 MG/ML IV SOLN
INTRAVENOUS | Status: DC | PRN
  Administered 2014-04-14 (×2): 5 mg via INTRAVENOUS

## 2014-04-14 MED ORDER — ONDANSETRON HCL 4 MG/2ML IJ SOLN
4.0000 mg | Freq: Once | INTRAMUSCULAR | Status: AC
  Administered 2014-04-14: 4 mg via INTRAVENOUS
  Filled 2014-04-14: qty 2

## 2014-04-14 MED ORDER — LIDOCAINE HCL (CARDIAC) 20 MG/ML IV SOLN
INTRAVENOUS | Status: DC | PRN
  Administered 2014-04-14: 50 mg via INTRAVENOUS

## 2014-04-14 MED ORDER — NEOSTIGMINE METHYLSULFATE 10 MG/10ML IV SOLN
INTRAVENOUS | Status: DC | PRN
  Administered 2014-04-14: 4 mg via INTRAVENOUS

## 2014-04-14 MED ORDER — SODIUM CHLORIDE 0.9 % IV BOLUS (SEPSIS)
1000.0000 mL | Freq: Once | INTRAVENOUS | Status: AC
  Administered 2014-04-14: 1000 mL via INTRAVENOUS

## 2014-04-14 MED ORDER — POTASSIUM CHLORIDE CRYS ER 20 MEQ PO TBCR
20.0000 meq | EXTENDED_RELEASE_TABLET | Freq: Every morning | ORAL | Status: DC
  Administered 2014-04-15: 20 meq via ORAL
  Filled 2014-04-14: qty 1

## 2014-04-14 MED ORDER — ROCURONIUM BROMIDE 100 MG/10ML IV SOLN
INTRAVENOUS | Status: AC
  Filled 2014-04-14: qty 1

## 2014-04-14 MED ORDER — MORPHINE SULFATE 4 MG/ML IJ SOLN
4.0000 mg | Freq: Once | INTRAMUSCULAR | Status: DC
  Filled 2014-04-14: qty 1

## 2014-04-14 MED ORDER — FENTANYL CITRATE 0.05 MG/ML IJ SOLN
INTRAMUSCULAR | Status: AC
  Filled 2014-04-14: qty 5

## 2014-04-14 MED ORDER — MIDAZOLAM HCL 2 MG/2ML IJ SOLN
INTRAMUSCULAR | Status: AC
  Filled 2014-04-14: qty 2

## 2014-04-14 MED ORDER — SODIUM CHLORIDE 0.9 % IV SOLN: INTRAVENOUS | Status: DC

## 2014-04-14 MED ORDER — CLONIDINE HCL 0.1 MG PO TABS
0.1000 mg | ORAL_TABLET | Freq: Two times a day (BID) | ORAL | Status: DC
  Administered 2014-04-14 – 2014-04-15 (×2): 0.1 mg via ORAL
  Filled 2014-04-14 (×3): qty 1

## 2014-04-14 MED ORDER — SODIUM CHLORIDE 0.9 % IV SOLN
INTRAVENOUS | Status: DC
  Administered 2014-04-14: 1000 mL via INTRAVENOUS
  Administered 2014-04-15: 75 mL via INTRAVENOUS

## 2014-04-14 MED ORDER — FUROSEMIDE 20 MG PO TABS
20.0000 mg | ORAL_TABLET | Freq: Every morning | ORAL | Status: DC
  Administered 2014-04-15: 20 mg via ORAL
  Filled 2014-04-14: qty 1

## 2014-04-14 MED ORDER — PROPOFOL 10 MG/ML IV BOLUS
INTRAVENOUS | Status: AC
  Filled 2014-04-14: qty 20

## 2014-04-14 MED ORDER — ROCURONIUM BROMIDE 100 MG/10ML IV SOLN
INTRAVENOUS | Status: DC | PRN
  Administered 2014-04-14: 20 mg via INTRAVENOUS

## 2014-04-14 MED ORDER — LIDOCAINE HCL (CARDIAC) 20 MG/ML IV SOLN
INTRAVENOUS | Status: AC
Start: 2014-04-14 — End: 2014-04-14
  Filled 2014-04-14: qty 5

## 2014-04-14 MED ORDER — INFLUENZA VAC SPLIT QUAD 0.5 ML IM SUSY
0.5000 mL | PREFILLED_SYRINGE | INTRAMUSCULAR | Status: DC
  Filled 2014-04-14 (×2): qty 0.5

## 2014-04-14 MED ORDER — IOHEXOL 300 MG/ML  SOLN
100.0000 mL | Freq: Once | INTRAMUSCULAR | Status: AC | PRN
  Administered 2014-04-14: 100 mL via INTRAVENOUS

## 2014-04-14 MED ORDER — ONDANSETRON HCL 4 MG/2ML IJ SOLN
INTRAMUSCULAR | Status: AC
  Filled 2014-04-14: qty 2

## 2014-04-14 MED ORDER — ONDANSETRON HCL 4 MG/2ML IJ SOLN: 4.0000 mg | Freq: Four times a day (QID) | INTRAMUSCULAR | Status: DC | PRN

## 2014-04-14 MED ORDER — SODIUM CHLORIDE 0.9 % IV SOLN
INTRAVENOUS | Status: DC | PRN
  Administered 2014-04-14: 18:00:00 via INTRAVENOUS

## 2014-04-14 MED ORDER — ONDANSETRON HCL 4 MG PO TABS: 4.0000 mg | ORAL_TABLET | Freq: Four times a day (QID) | ORAL | Status: DC | PRN

## 2014-04-14 MED ORDER — SUCCINYLCHOLINE CHLORIDE 20 MG/ML IJ SOLN
INTRAMUSCULAR | Status: DC | PRN
  Administered 2014-04-14: 140 mg via INTRAVENOUS

## 2014-04-14 MED ORDER — CHLORHEXIDINE GLUCONATE 0.12 % MT SOLN
15.0000 mL | Freq: Two times a day (BID) | OROMUCOSAL | Status: DC
  Administered 2014-04-14 – 2014-04-15 (×2): 15 mL via OROMUCOSAL
  Filled 2014-04-14 (×4): qty 15

## 2014-04-14 MED ORDER — IOHEXOL 300 MG/ML  SOLN
50.0000 mL | Freq: Once | INTRAMUSCULAR | Status: AC | PRN
  Administered 2014-04-14: 50 mL via ORAL

## 2014-04-14 MED ORDER — KETOROLAC TROMETHAMINE 15 MG/ML IJ SOLN
INTRAMUSCULAR | Status: AC
  Filled 2014-04-14: qty 1

## 2014-04-14 MED ORDER — CEFOTETAN DISODIUM 2 G IJ SOLR
2.0000 g | Freq: Once | INTRAMUSCULAR | Status: AC
  Administered 2014-04-14: 2 g via INTRAVENOUS
  Filled 2014-04-14: qty 2

## 2014-04-14 MED ORDER — GLYCOPYRROLATE 0.2 MG/ML IJ SOLN
INTRAMUSCULAR | Status: DC | PRN
  Administered 2014-04-14: 0.6 mg via INTRAVENOUS

## 2014-04-14 MED ORDER — LORATADINE 10 MG PO TABS
10.0000 mg | ORAL_TABLET | Freq: Every day | ORAL | Status: DC
  Administered 2014-04-15: 10 mg via ORAL
  Filled 2014-04-14: qty 1

## 2014-04-14 MED ORDER — LACTATED RINGERS IV SOLN: INTRAVENOUS | Status: DC

## 2014-04-14 MED ORDER — DEXAMETHASONE SODIUM PHOSPHATE 10 MG/ML IJ SOLN
INTRAMUSCULAR | Status: DC | PRN
  Administered 2014-04-14: 10 mg via INTRAVENOUS

## 2014-04-14 MED ORDER — ENOXAPARIN SODIUM 40 MG/0.4ML ~~LOC~~ SOLN
40.0000 mg | SUBCUTANEOUS | Status: DC
  Administered 2014-04-15: 40 mg via SUBCUTANEOUS
  Filled 2014-04-14 (×2): qty 0.4

## 2014-04-14 MED ORDER — PROPOFOL 10 MG/ML IV BOLUS
INTRAVENOUS | Status: DC | PRN
  Administered 2014-04-14: 180 mg via INTRAVENOUS

## 2014-04-14 MED ORDER — ONDANSETRON HCL 4 MG/2ML IJ SOLN
INTRAMUSCULAR | Status: DC | PRN
  Administered 2014-04-14: 4 mg via INTRAVENOUS

## 2014-04-14 MED ORDER — FENTANYL CITRATE 0.05 MG/ML IJ SOLN
INTRAMUSCULAR | Status: DC | PRN
  Administered 2014-04-14: 50 ug via INTRAVENOUS
  Administered 2014-04-14 (×2): 100 ug via INTRAVENOUS

## 2014-04-14 MED ORDER — MEPERIDINE HCL 50 MG/ML IJ SOLN: 6.2500 mg | INTRAMUSCULAR | Status: DC | PRN

## 2014-04-14 MED ORDER — LACTATED RINGERS IR SOLN
Status: DC | PRN
  Administered 2014-04-14: 1

## 2014-04-14 MED ORDER — KETOROLAC TROMETHAMINE 15 MG/ML IJ SOLN
15.0000 mg | Freq: Once | INTRAMUSCULAR | Status: AC
Start: 2014-04-14 — End: 2014-04-14
  Administered 2014-04-14: 15 mg via INTRAVENOUS

## 2014-04-14 MED ORDER — BUPIVACAINE-EPINEPHRINE 0.5% -1:200000 IJ SOLN
INTRAMUSCULAR | Status: AC
  Filled 2014-04-14: qty 1

## 2014-04-14 MED ORDER — MORPHINE SULFATE 2 MG/ML IJ SOLN
2.0000 mg | INTRAMUSCULAR | Status: DC | PRN
  Administered 2014-04-14 – 2014-04-15 (×2): 2 mg via INTRAVENOUS
  Filled 2014-04-14 (×2): qty 1

## 2014-04-14 MED ORDER — 0.9 % SODIUM CHLORIDE (POUR BTL) OPTIME
TOPICAL | Status: DC | PRN
  Administered 2014-04-14: 1000 mL

## 2014-04-14 MED ORDER — DEXAMETHASONE SODIUM PHOSPHATE 10 MG/ML IJ SOLN
INTRAMUSCULAR | Status: AC
  Filled 2014-04-14: qty 1

## 2014-04-14 MED ORDER — METOPROLOL TARTRATE 12.5 MG HALF TABLET
12.5000 mg | ORAL_TABLET | Freq: Every day | ORAL | Status: DC
  Administered 2014-04-14: 12.5 mg via ORAL
  Filled 2014-04-14 (×2): qty 1

## 2014-04-14 MED ORDER — LATANOPROST 0.005 % OP SOLN
1.0000 [drp] | Freq: Every day | OPHTHALMIC | Status: DC
  Administered 2014-04-14: 1 [drp] via OPHTHALMIC
  Filled 2014-04-14: qty 2.5

## 2014-04-14 MED ORDER — PROMETHAZINE HCL 25 MG/ML IJ SOLN
6.2500 mg | INTRAMUSCULAR | Status: DC | PRN
Start: 2014-04-14 — End: 2014-04-14

## 2014-04-14 MED ORDER — OXYCODONE-ACETAMINOPHEN 5-325 MG PO TABS
1.0000 | ORAL_TABLET | ORAL | Status: DC | PRN
  Administered 2014-04-15: 1 via ORAL
  Administered 2014-04-15: 2 via ORAL
  Administered 2014-04-15: 1 via ORAL
  Filled 2014-04-14: qty 2
  Filled 2014-04-14 (×2): qty 1

## 2014-04-14 MED ORDER — CETYLPYRIDINIUM CHLORIDE 0.05 % MT LIQD
7.0000 mL | Freq: Two times a day (BID) | OROMUCOSAL | Status: DC
  Administered 2014-04-15: 7 mL via OROMUCOSAL

## 2014-04-14 SURGICAL SUPPLY — 40 items
ADH SKN CLS APL DERMABOND .7 (GAUZE/BANDAGES/DRESSINGS) ×1
APPLIER CLIP 5 13 M/L LIGAMAX5 (MISCELLANEOUS)
APPLIER CLIP ROT 10 11.4 M/L (STAPLE)
APR CLP MED LRG 11.4X10 (STAPLE)
APR CLP MED LRG 5 ANG JAW (MISCELLANEOUS)
BAG SPEC RTRVL LRG 6X4 10 (ENDOMECHANICALS) ×1
CABLE HI FREQUENCY MONOPOLAR (ELECTROSURGICAL) IMPLANT
CANISTER SUCTION 2500CC (MISCELLANEOUS) ×1 IMPLANT
CHLORAPREP W/TINT 26ML (MISCELLANEOUS) ×2 IMPLANT
CLIP APPLIE 5 13 M/L LIGAMAX5 (MISCELLANEOUS) IMPLANT
CLIP APPLIE ROT 10 11.4 M/L (STAPLE) IMPLANT
CUTTER FLEX LINEAR 45M (STAPLE) ×1 IMPLANT
DECANTER SPIKE VIAL GLASS SM (MISCELLANEOUS) ×2 IMPLANT
DERMABOND ADVANCED (GAUZE/BANDAGES/DRESSINGS) ×1
DERMABOND ADVANCED .7 DNX12 (GAUZE/BANDAGES/DRESSINGS) ×1 IMPLANT
DRAPE LAPAROSCOPIC ABDOMINAL (DRAPES) ×2 IMPLANT
DRAPE UTILITY XL STRL (DRAPES) ×2 IMPLANT
ELECT REM PT RETURN 9FT ADLT (ELECTROSURGICAL) ×2
ELECTRODE REM PT RTRN 9FT ADLT (ELECTROSURGICAL) ×1 IMPLANT
GLOVE BIO SURGEON STRL SZ 6.5 (GLOVE) ×2 IMPLANT
GLOVE BIOGEL PI IND STRL 7.0 (GLOVE) ×1 IMPLANT
GLOVE BIOGEL PI INDICATOR 7.0 (GLOVE) ×1
GOWN SPEC L4 XLG W/TWL (GOWN DISPOSABLE) ×3 IMPLANT
KIT BASIN OR (CUSTOM PROCEDURE TRAY) ×2 IMPLANT
POUCH SPECIMEN RETRIEVAL 10MM (ENDOMECHANICALS) ×2 IMPLANT
RELOAD 45 VASCULAR/THIN (ENDOMECHANICALS) ×6 IMPLANT
RELOAD STAPLE 45 3.5 BLU ETS (ENDOMECHANICALS) IMPLANT
RELOAD STAPLE TA45 3.5 REG BLU (ENDOMECHANICALS) ×2 IMPLANT
SET IRRIG TUBING LAPAROSCOPIC (IRRIGATION / IRRIGATOR) ×2 IMPLANT
SHEARS HARMONIC ACE PLUS 36CM (ENDOMECHANICALS) IMPLANT
SOLUTION ANTI FOG 6CC (MISCELLANEOUS) ×2 IMPLANT
SUT VIC AB 4-0 PS2 27 (SUTURE) ×2 IMPLANT
TOWEL OR 17X26 10 PK STRL BLUE (TOWEL DISPOSABLE) ×2 IMPLANT
TOWEL OR NON WOVEN STRL DISP B (DISPOSABLE) ×2 IMPLANT
TRAY FOLEY CATH 14FRSI W/METER (CATHETERS) ×2 IMPLANT
TRAY LAP CHOLE (CUSTOM PROCEDURE TRAY) ×2 IMPLANT
TROCAR BLADELESS OPT 5 75 (ENDOMECHANICALS) ×4 IMPLANT
TROCAR XCEL 12X100 BLDLESS (ENDOMECHANICALS) ×1 IMPLANT
TROCAR XCEL BLUNT TIP 100MML (ENDOMECHANICALS) ×2 IMPLANT
TUBING INSUFFLATION 10FT LAP (TUBING) ×2 IMPLANT

## 2014-04-14 NOTE — Transfer of Care (Signed)
Immediate Anesthesia Transfer of Care Note  Patient: Leah Moore  Procedure(s) Performed: Procedure(s): APPENDECTOMY LAPAROSCOPIC (N/A)  Patient Location: PACU  Anesthesia Type:General  Level of Consciousness: awake, alert  and oriented  Airway & Oxygen Therapy: Patient Spontanous Breathing and Patient connected to face mask oxygen  Post-op Assessment: Report given to PACU RN and Post -op Vital signs reviewed and stable  Post vital signs: Reviewed and stable  Complications: No apparent anesthesia complications

## 2014-04-14 NOTE — H&P (Signed)
Leah Moore is an 48 y.o. female.    Chief Complaint: Abdominal pain  HPI: Patient is a pleasant 48 year old female who was in her usual state of health until approximately 4 days ago. At that point she had the gradual onset of right lower quadrant abdominal pain. Initially this was mild nagging pain but over the last 3 days has steadily increased in intensity. Yesterday it became constant. She developed nausea after eating yesterday but no vomiting. She also had some diarrhea yesterday. Today she noted fever at home and presented for evaluation. The pain is constant and aching in the right lower quadrant without any exacerbating factors. It seems to be relieved by holding an ice pack in the area. She has not bothered by any chronic GI complaints other than some intermittent long-term constipation.  Past Medical History  Diagnosis Date  . Hypertension   . Glaucoma   . Mitral valve prolapse   . Vestibular vertigo   . GERD (gastroesophageal reflux disease)   . Headache(784.0)     migraines  . Arthritis   . H/O echocardiogram 2014    Past Surgical History  Procedure Laterality Date  . Knee arthroscopy Left     48 yrs old  . Cholecystectomy N/A 06/30/2013    Procedure: LAPAROSCOPIC CHOLECYSTECTOMY WITH INTRAOPERATIVE CHOLANGIOGRAM;  Surgeon: Joyice Faster. Cornett, MD;  Location: [facility];  Service: General;  Laterality: N/A;    Family History  Problem Relation Age of Onset  . Arthritis Mother   . Hypertension Mother   . Diabetes Mother   . Glaucoma Mother   . COPD Mother   . Hypertension Father   . Stroke Father   . Hypertension Brother   . Glaucoma Brother   . Stroke Maternal Grandmother   . Heart disease Maternal Grandmother   . COPD Maternal Grandfather   . Heart disease Maternal Grandfather   . Cancer Maternal Grandfather     pancreatic  . Cancer Paternal Grandmother   . Hypertension Paternal Grandmother   . Arthritis Paternal Grandmother   . Heart disease Paternal  Grandfather    Social History:  reports that she has never smoked. She has never used smokeless tobacco. She reports that she drinks alcohol. She reports that she does not use illicit drugs.  Allergies:  Allergies  Allergen Reactions  . Lisinopril Other (See Comments)    Facial Edema (Lips)    Current Facility-Administered Medications  Medication Dose Route Frequency Provider Last Rate Last Dose  . [START ON 04/15/2014] Influenza vac split quadrivalent PF (FLUARIX) injection 0.5 mL  0.5 mL Intramuscular Tomorrow-1000 Ephraim Hamburger, MD      . morphine 4 MG/ML injection 4 mg  4 mg Intravenous Once Ephraim Hamburger, MD       Current Outpatient Prescriptions  Medication Sig Dispense Refill  . acetaminophen (TYLENOL) 500 MG tablet Take 1,000 mg by mouth every 6 (six) hours as needed (For pain and fever.).      Marland Kitchen aspirin EC 81 MG tablet Take 81 mg by mouth every morning.      . cetirizine (ZYRTEC) 10 MG tablet Take 10 mg by mouth at bedtime.       . cloNIDine (CATAPRES) 0.1 MG tablet Take 0.1 mg by mouth 2 (two) times daily.      . furosemide (LASIX) 20 MG tablet Take 20 mg by mouth every morning.      . latanoprost (XALATAN) 0.005 % ophthalmic solution Place 1 drop into both eyes at  bedtime.       . metoprolol tartrate (LOPRESSOR) 25 MG tablet Take 12.5 mg by mouth at bedtime.       . potassium chloride SA (K-DUR,KLOR-CON) 20 MEQ tablet Take 20 mEq by mouth every morning.      . Vitamin D, Ergocalciferol, (DRISDOL) 50000 UNITS CAPS capsule Take 50,000 Units by mouth every Wednesday.         Results for orders placed during the hospital encounter of 04/14/14 (from the past 48 hour(s))  CBC WITH DIFFERENTIAL     Status: None   Collection Time    04/14/14 12:26 PM      Result Value Ref Range   WBC 5.4  4.0 - 10.5 K/uL   RBC 4.05  3.87 - 5.11 MIL/uL   Hemoglobin 12.2  12.0 - 15.0 g/dL   HCT 88.9  16.9 - 45.0 %   MCV 91.6  78.0 - 100.0 fL   MCH 30.1  26.0 - 34.0 pg   MCHC 32.9  30.0  - 36.0 g/dL   RDW 38.8  82.8 - 00.3 %   Platelets 205  150 - 400 K/uL   Neutrophils Relative % 61  43 - 77 %   Neutro Abs 3.3  1.7 - 7.7 K/uL   Lymphocytes Relative 30  12 - 46 %   Lymphs Abs 1.6  0.7 - 4.0 K/uL   Monocytes Relative 5  3 - 12 %   Monocytes Absolute 0.3  0.1 - 1.0 K/uL   Eosinophils Relative 4  0 - 5 %   Eosinophils Absolute 0.2  0.0 - 0.7 K/uL   Basophils Relative 0  0 - 1 %   Basophils Absolute 0.0  0.0 - 0.1 K/uL  COMPREHENSIVE METABOLIC PANEL     Status: Abnormal   Collection Time    04/14/14 12:26 PM      Result Value Ref Range   Sodium 139  137 - 147 mEq/L   Potassium 4.0  3.7 - 5.3 mEq/L   Chloride 100  96 - 112 mEq/L   CO2 28  19 - 32 mEq/L   Glucose, Bld 92  70 - 99 mg/dL   BUN 9  6 - 23 mg/dL   Creatinine, Ser 4.91  0.50 - 1.10 mg/dL   Calcium 9.7  8.4 - 79.1 mg/dL   Total Protein 8.1  6.0 - 8.3 g/dL   Albumin 4.3  3.5 - 5.2 g/dL   AST 18  0 - 37 U/L   ALT 18  0 - 35 U/L   Alkaline Phosphatase 89  39 - 117 U/L   Total Bilirubin 0.7  0.3 - 1.2 mg/dL   GFR calc non Af Amer 73 (*) >90 mL/min   GFR calc Af Amer 84 (*) >90 mL/min   Comment: (NOTE)     The eGFR has been calculated using the CKD EPI equation.     This calculation has not been validated in all clinical situations.     eGFR's persistently <90 mL/min signify possible Chronic Kidney     Disease.   Anion gap 11  5 - 15  URINALYSIS, ROUTINE W REFLEX MICROSCOPIC     Status: Abnormal   Collection Time    04/14/14  2:05 PM      Result Value Ref Range   Color, Urine AMBER (*) YELLOW   Comment: BIOCHEMICALS MAY BE AFFECTED BY COLOR   APPearance CLOUDY (*) CLEAR   Specific Gravity, Urine 1.027  1.005 -  1.030   pH 5.5  5.0 - 8.0   Glucose, UA NEGATIVE  NEGATIVE mg/dL   Hgb urine dipstick NEGATIVE  NEGATIVE   Bilirubin Urine SMALL (*) NEGATIVE   Ketones, ur NEGATIVE  NEGATIVE mg/dL   Protein, ur NEGATIVE  NEGATIVE mg/dL   Urobilinogen, UA 1.0  0.0 - 1.0 mg/dL   Nitrite NEGATIVE  NEGATIVE    Leukocytes, UA TRACE (*) NEGATIVE  URINE MICROSCOPIC-ADD ON     Status: Abnormal   Collection Time    04/14/14  2:05 PM      Result Value Ref Range   Squamous Epithelial / LPF FEW (*) RARE   WBC, UA 3-6  <3 WBC/hpf   Bacteria, UA RARE  RARE   Urine-Other MUCOUS PRESENT     Ct Abdomen Pelvis W Contrast  04/14/2014   CLINICAL DATA:  Right lower quadrant pain.  EXAM: CT ABDOMEN AND PELVIS WITH CONTRAST  TECHNIQUE: Multidetector CT imaging of the abdomen and pelvis was performed using the standard protocol following bolus administration of intravenous contrast.  CONTRAST:  182mL OMNIPAQUE IOHEXOL 300 MG/ML  SOLN  COMPARISON:  05/18/2013.  FINDINGS: Lung Bases: Dependent atelectasis noted bilaterally.  Liver:  No focal abnormality. No evidence for hepatomegaly.  Spleen: No splenomegaly. No focal mass lesion.  Stomach: Nondistended. No gastric wall thickening. No evidence of outlet obstruction.  Pancreas: No focal mass lesion. No dilatation of the main duct. No intraparenchymal cyst. No peripancreatic edema.  Gallbladder/Biliary: Gallbladder is surgically absent. No intra or extrahepatic biliary duct dilatation.  Kidneys/Adrenals: 15 mm right adrenal nodule is unchanged in the interval. Although this cannot be characterized as an adenoma based on contrast washout characteristics, the 1 year imaging stability is reassuring for a benign process and this is likely an adrenal adenoma. Right kidney is normal. Stable tiny cyst noted in the lower pole left kidney.  Bowel Loops: Duodenum is normally positioned as is the ligament of Treitz. No evidence for small bowel dilatation or small bowel wall thickening. Terminal ileum is normal. The appendix is thickened measuring up to 15 mm in diameter. There is subtle periappendiceal edema/inflammation towards the tip of the appendix. Colon is unremarkable.  Nodes: There is no lymphadenopathy in the abdomen or pelvis.  Vasculature: No abdominal aortic aneurysm.  Pelvic  Genitourinary: Bladder is unremarkable. Uterus has normal CT imaging features. No adnexal mass.  Bones/Musculoskeletal: Bone windows reveal no worrisome lytic or sclerotic osseous lesions.  Body Wall: No evidence for abdominal wall hernia.  Other: No intraperitoneal free fluid.  IMPRESSION: Dilated appendix with mild edema/ inflammation adjacent to the appendiceal tip. Imaging features are compatible with acute appendicitis. No evidence for perforation or abscess.  These test results were telephoned by me to Dr. Regenia Skeeter at the time of interpretation (1551 hours on 04/14/2014).   Electronically Signed   By: Misty Stanley M.D.   On: 04/14/2014 15:51    Review of Systems  Constitutional: Positive for fever and chills.  HENT: Negative.   Respiratory: Negative.   Cardiovascular: Negative.   Gastrointestinal: Positive for nausea and abdominal pain. Negative for vomiting, constipation and blood in stool.  Genitourinary: Negative.   Musculoskeletal: Negative.     Blood pressure 130/92, pulse 82, temperature 97.8 F (36.6 C), temperature source Oral, resp. rate 18, SpO2 100.00%. Physical Exam  General: Alert, well-developed African American female, in no distress Skin: Warm and dry without rash or infection. HEENT: No palpable masses or thyromegaly. Sclera nonicteric. Pupils equal round and reactive. Oropharynx  clear. Lymph nodes: No cervical, supraclavicular, or inguinal nodes palpable. Lungs: Breath sounds clear and equal without increased work of breathing Cardiovascular: Regular rate and rhythm without murmur. No JVD or edema. Peripheral pulses intact. Abdomen: Nondistended. There is localized right lower quadrant tenderness with some guarding but no peritoneal signs. Remainder of her abdomen is soft and nontender. Well-healed laparoscopic incisions.. No masses palpable. No organomegaly. No palpable hernias. Extremities: No edema or joint swelling or deformity. No chronic venous stasis  changes. Neurologic: Alert and fully oriented. No gross motor deficits.  Assessment/Plan History and physical findings consistent with acute appendicitis. White blood count is normal but she had a subjective fever. CT scan is consistent with early acute appendicitis. I have recommended proceeding with laparoscopic appendectomy. I discussed the indications for the surgery as well as risks of anesthetic complications, bleeding, infection or intestinal injury. She understands and agrees to proceed. She is receiving preoperative broad-spectrum IV antibiotics.  Gudelia Eugene T 04/14/2014, 5:13 PM

## 2014-04-14 NOTE — ED Provider Notes (Signed)
CSN: [id number]     Arrival date & time 04/14/14  1155 History   First MD Initiated Contact with Patient 04/14/14 1333     Chief Complaint  Patient presents with  . RLQ pain      (Consider location/radiation/quality/duration/timing/severity/associated sxs/prior Treatment) HPI 48 year old female presents with right lower quadrant abdominal pain over the last 4 days. She saw her gastrologist today after their exam they referred her to the ER for a CT scan to rule out appendicitis. States the pain has been pretty constant over these days. She felt like it somewhat improved a couple days ago but is still moderate to severe. She's had nausea and fever over the last 4 days. Had a temperature of 102 prior to going to gastroenterologist and took Tylenol. She's had decreased appetite. She denies any hematuria or dysuria. She had diarrhea yesterday which at the end of she started noticing a lobe of blood. She states on the GI doctor did a rectal exam and there was a little bit of blood as well. She's not on any blood thinners.  Past Medical History  Diagnosis Date  . Hypertension   . Glaucoma   . Mitral valve prolapse   . Vestibular vertigo   . GERD (gastroesophageal reflux disease)   . Headache(784.0)     migraines  . Arthritis   . H/O echocardiogram 2014   Past Surgical History  Procedure Laterality Date  . Knee arthroscopy Left     48 yrs old  . Cholecystectomy N/A 06/30/2013    Procedure: LAPAROSCOPIC CHOLECYSTECTOMY WITH INTRAOPERATIVE CHOLANGIOGRAM;  Surgeon: Clovis Pu. Cornett, MD;  Location: [facility];  Service: General;  Laterality: N/A;   Family History  Problem Relation Age of Onset  . Arthritis Mother   . Hypertension Mother   . Diabetes Mother   . Glaucoma Mother   . COPD Mother   . Hypertension Father   . Stroke Father   . Hypertension Brother   . Glaucoma Brother   . Stroke Maternal Grandmother   . Heart disease Maternal Grandmother   . COPD Maternal Grandfather   . Heart  disease Maternal Grandfather   . Cancer Maternal Grandfather     pancreatic  . Cancer Paternal Grandmother   . Hypertension Paternal Grandmother   . Arthritis Paternal Grandmother   . Heart disease Paternal Grandfather    History  Substance Use Topics  . Smoking status: Never Smoker   . Smokeless tobacco: Never Used     Comment: social alcohol  . Alcohol Use: Yes     Comment: socially   OB History   Grav Para Term Preterm Abortions TAB SAB Ect Mult Living   Review of Systems  Constitutional: Positive for fever.  Gastrointestinal: Positive for nausea, abdominal pain, diarrhea and blood in stool. Negative for vomiting.  Genitourinary: Negative for dysuria and hematuria.  Musculoskeletal: Negative for back pain.  All other systems reviewed and are negative.     Allergies  Lisinopril  Home Medications   Prior to Admission medications   Medication Sig Start Date End Date Taking? Authorizing Provider  acetaminophen (TYLENOL) 500 MG tablet Take 1,000 mg by mouth every 6 (six) hours as needed (For pain and fever.).   Yes Historical Provider, MD  aspirin EC 81 MG tablet Take 81 mg by mouth every morning.   Yes Historical Provider, MD  cetirizine (ZYRTEC) 10 MG tablet Take 10 mg by  mouth at bedtime.    Yes Historical Provider, MD  cloNIDine (CATAPRES) 0.1 MG tablet Take 0.1 mg by mouth 2 (two) times daily.   Yes Historical Provider, MD  furosemide (LASIX) 20 MG tablet Take 20 mg by mouth every morning.   Yes Historical Provider, MD  latanoprost (XALATAN) 0.005 % ophthalmic solution Place 1 drop into both eyes at bedtime.    Yes Historical Provider, MD  metoprolol tartrate (LOPRESSOR) 25 MG tablet Take 12.5 mg by mouth at bedtime.    Yes Historical Provider, MD  potassium chloride SA (K-DUR,KLOR-CON) 20 MEQ tablet Take 20 mEq by mouth every morning.   Yes Historical Provider, MD  Vitamin D, Ergocalciferol, (DRISDOL) 50000 UNITS CAPS capsule Take 50,000 Units by  mouth every Wednesday.   Yes Historical Provider, MD   BP 156/122  Pulse 79  Temp(Src) 98.4 F (36.9 C) (Oral)  Resp 18  SpO2 100% Physical Exam  Nursing note and vitals reviewed. Constitutional: She is oriented to person, place, and time. She appears well-developed and well-nourished.  HENT:  Head: Normocephalic and atraumatic.  Right Ear: External ear normal.  Left Ear: External ear normal.  Nose: Nose normal.  Eyes: Right eye exhibits no discharge. Left eye exhibits no discharge.  Cardiovascular: Normal rate, regular rhythm and normal heart sounds.   Pulmonary/Chest: Effort normal and breath sounds normal.  Abdominal: Soft. There is tenderness in the right lower quadrant.  Right lower quadrant tenderness increased by palpating the left lower quadrant. Mild pain in her abdomen with tapping on her heels.  Neurological: She is alert and oriented to person, place, and time.  Skin: Skin is warm and dry.    ED Course  Procedures (including critical care time) Labs Review Labs Reviewed  COMPREHENSIVE METABOLIC PANEL - Abnormal; Notable for the following:    GFR calc non Af Amer 73 (*)    GFR calc Af Amer 84 (*)    All other components within normal limits  URINALYSIS, ROUTINE W REFLEX MICROSCOPIC - Abnormal; Notable for the following:    Color, Urine AMBER (*)    APPearance CLOUDY (*)    Bilirubin Urine SMALL (*)    Leukocytes, UA TRACE (*)    All other components within normal limits  URINE MICROSCOPIC-ADD ON - Abnormal; Notable for the following:    Squamous Epithelial / LPF FEW (*)    All other components within normal limits  CBC WITH DIFFERENTIAL    Imaging Review Ct Abdomen Pelvis W Contrast  04/14/2014   CLINICAL DATA:  Right lower quadrant pain.  EXAM: CT ABDOMEN AND PELVIS WITH CONTRAST  TECHNIQUE: Multidetector CT imaging of the abdomen and pelvis was performed using the standard protocol following bolus administration of intravenous contrast.  CONTRAST:   OMNIPAQUE IOHEXOL 300 MG/ML  SOLN  COMPARISON:  05/18/2013.  FINDINGS: Lung Bases: Dependent atelectasis noted bilaterally.  Liver:  No focal abnormality. No evidence for hepatomegaly.  Spleen: No splenomegaly. No focal mass lesion.  Stomach: Nondistended. No gastric wall thickening. No evidence of outlet obstruction.  Pancreas: No focal mass lesion. No dilatation of the main duct. No intraparenchymal cyst. No peripancreatic edema.  Gallbladder/Biliary: Gallbladder is surgically absent. No intra or extrahepatic biliary duct dilatation.  Kidneys/Adrenals: 15 mm right adrenal nodule is unchanged in the interval. Although this cannot be characterized as an adenoma based on contrast washout characteristics, the 1 year imaging stability is reassuring for a benign process and this is likely an adrenal adenoma. Right kidney is normal.  Stable tiny cyst noted in the lower pole left kidney.  Bowel Loops: Duodenum is normally positioned as is the ligament of Treitz. No evidence for small bowel dilatation or small bowel wall thickening. Terminal ileum is normal. The appendix is thickened measuring up to 15 mm in diameter. There is subtle periappendiceal edema/inflammation towards the tip of the appendix. Colon is unremarkable.  Nodes: There is no lymphadenopathy in the abdomen or pelvis.  Vasculature: No abdominal aortic aneurysm.  Pelvic Genitourinary: Bladder is unremarkable. Uterus has normal CT imaging features. No adnexal mass.  Bones/Musculoskeletal: Bone windows reveal no worrisome lytic or sclerotic osseous lesions.  Body Wall: No evidence for abdominal wall hernia.  Other: No intraperitoneal free fluid.  IMPRESSION: Dilated appendix with mild edema/ inflammation adjacent to the appendiceal tip. Imaging features are compatible with acute appendicitis. No evidence for perforation or abscess.  These test results were telephoned by me to Dr. Criss Alvine at the time of interpretation (1551 hours on 04/14/2014).    Electronically Signed   By: Kennith Center M.D.   On: 04/14/2014 15:51     EKG Interpretation None      MDM   Final diagnoses:  Acute appendicitis, unspecified acute appendicitis type    CT shows uncomplicated acute appendicitis. Patient declined morphine in ED. No peritonitis. Multiple pages sent to surgeon with no response. Care transferred with surgery consult pending.    Audree Camel, MD 04/14/14 [phone]

## 2014-04-14 NOTE — Op Note (Signed)
Leah Moore [id number]   PRE-OPERATIVE DIAGNOSIS:  APPENDICITIS  POST-OPERATIVE DIAGNOSIS:  Tip Appendicitis   Procedure(s): APPENDECTOMY LAPAROSCOPIC  SURGEON:  Surgeon(s): Romie Levee, MD  ASSISTANT: none   ANESTHESIA:   local and general  EBL:   50ml  Delay start of Pharmacological VTE agent (>24hrs) due to surgical blood loss or risk of bleeding:  no  DRAINS: none   SPECIMEN:  Source of Specimen:  appendix  DISPOSITION OF SPECIMEN:  PATHOLOGY  COUNTS:  YES  PLAN OF CARE: Admit for overnight observation  PATIENT DISPOSITION:  PACU - hemodynamically stable.   INDICATIONS: Patient with concerning symptoms & work up suspicious for appendicitis.  Surgery was recommended:  The anatomy & physiology of the digestive tract was discussed.  The pathophysiology of appendicitis was discussed.  Natural history risks without surgery was discussed.   I feel the risks of no intervention will lead to serious problems that outweigh the operative risks; therefore, I recommended diagnostic laparoscopy with removal of appendix to remove the pathology.  Laparoscopic & open techniques were discussed.   I noted a good likelihood this will help address the problem.    Risks such as bleeding, infection, abscess, leak, reoperation, possible ostomy, hernia, heart attack, death, and other risks were discussed.  Goals of post-operative recovery were discussed as well.  We will work to minimize complications.  Questions were answered.  The patient expresses understanding & wishes to proceed with surgery.  OR FINDINGS: Tip appendicitis  DESCRIPTION:   The patient was identified & brought into the operating room. The patient was positioned supine with left arm tucked. SCDs were active during the entire case. The patient underwent general anesthesia without any difficulty.  A foley catheter was inserted under sterile conditions. The abdomen was prepped and draped in a sterile fashion. A Surgical  Timeout confirmed our plan.   I made a vertical incision through the inferior umbilical fold and her previous scar.  I made a nick in the infraumbilical fascia and confirmed peritoneal entry.  I placed a stay suture and then the [facility] port.  We induced carbon dioxide insufflation.  Camera inspection revealed no injury.  I placed additional ports under direct laparoscopic visualization.  I mobilized the terminal ileum to proximal ascending colon in a lateral to medial fashion.  I took care to avoid injuring any retroperitoneal structures.   I freed the appendix off its attachments to the ascending colon and cecal mesentery.  I elevated the appendix. I skeletonized & ligated the mesoappendix. I was able to free off the base of the appendix which was still viable.  I stapled the appendix off the cecum using a laparoscopic stapler. I took a healthy cuff viable cecum. I used 2 white load staples to transect the mesoappendix.  I placed the appendix inside an EndoCatch bag and removed out the [place] port.  I did copious irrigation. Hemostasis was good in the mesoappendix, colon mesentery, and retroperitoneum. Staple line was intact on the cecum with no bleeding. I washed out the pelvis, retrohepatic space and right paracolic gutter. I washed out the left side as well.  Hemostasis is good. There was no perforation or injury.  Because the area cleaned up well after irrigation, I did not place a drain.  I aspirated the carbon dioxide. I removed the ports. I closed the umbilical fascia site using a 0 Vicryl stitch. I closed skin using 4-0 vicryl stitch.  Sterile dressings were applied.  Patient was extubated and sent to  the recovery room.  I discussed the operative findings with the patient's family. I suspect the patient is going used in the hospital at least overnight and will need antibiotics for 0 days. Questions answered. They expressed understanding and appreciation.

## 2014-04-14 NOTE — ED Notes (Signed)
PT Belongings placed in LOCKER NUMBER 32. One black purse and one belonging bag.

## 2014-04-14 NOTE — Anesthesia Preprocedure Evaluation (Signed)
Anesthesia Evaluation  Patient identified by MRN, date of birth, ID band Patient awake    Reviewed: Allergy & Precautions, H&P , NPO status , Patient's Chart, lab work & pertinent test results  Airway Mallampati: II TM Distance: >3 FB Neck ROM: Full    Dental no notable dental hx.    Pulmonary neg pulmonary ROS,  breath sounds clear to auscultation  Pulmonary exam normal       Cardiovascular hypertension, Pt. on medications Rhythm:Regular Rate:Normal     Neuro/Psych negative neurological ROS  negative psych ROS   GI/Hepatic negative GI ROS, Neg liver ROS,   Endo/Other  negative endocrine ROS  Renal/GU negative Renal ROS  negative genitourinary   Musculoskeletal negative musculoskeletal ROS (+)   Abdominal   Peds negative pediatric ROS (+)  Hematology negative hematology ROS (+)   Anesthesia Other Findings   Reproductive/Obstetrics negative OB ROS                           Anesthesia Physical Anesthesia Plan  ASA: II  Anesthesia Plan: General   Post-op Pain Management:    Induction: Intravenous, Rapid sequence and Cricoid pressure planned  Airway Management Planned: Oral ETT  Additional Equipment:   Intra-op Plan:   Post-operative Plan: Extubation in OR  Informed Consent: I have reviewed the patients History and Physical, chart, labs and discussed the procedure including the risks, benefits and alternatives for the proposed anesthesia with the patient or authorized representative who has indicated his/her understanding and acceptance.   Dental advisory given  Plan Discussed with: CRNA  Anesthesia Plan Comments:         Anesthesia Quick Evaluation

## 2014-04-14 NOTE — ED Notes (Signed)
Surgeon at bedside.  

## 2014-04-14 NOTE — ED Notes (Signed)
Brother picked belongings up from staff from locker 32. One black purse and one white belonging bag.

## 2014-04-14 NOTE — ED Notes (Signed)
Per pt, states RLQ pain since Sunday-went to GI and was told to come here for scan-nausea, fever

## 2014-04-15 ENCOUNTER — Encounter (HOSPITAL_COMMUNITY): Payer: Self-pay | Admitting: General Surgery

## 2014-04-15 MED ORDER — OXYCODONE-ACETAMINOPHEN 5-325 MG PO TABS
1.0000 | ORAL_TABLET | Freq: Four times a day (QID) | ORAL | Status: DC | PRN
Start: 2014-04-15 — End: 2014-05-12

## 2014-04-15 NOTE — Anesthesia Postprocedure Evaluation (Signed)
  Anesthesia Post-op Note  Patient: Leah Moore  Procedure(s) Performed: Procedure(s): APPENDECTOMY LAPAROSCOPIC (N/A) Patient is awake and responsive. Pain and nausea are reasonably well controlled. Vital signs are stable and clinically acceptable. Oxygen saturation is clinically acceptable. There are no apparent anesthetic complications at this time. Patient is ready for discharge.

## 2014-04-15 NOTE — Discharge Summary (Signed)
Patient interviewed and examined, agree with PA note above.  Mariella Saa MD, FACS  04/15/2014 2:34 PM

## 2014-04-15 NOTE — Progress Notes (Signed)
Assessment unchanged.  Pt has all personal belongings.  Pt has received verbal and written discharge information as well as written prescriptions.  Pt verbalizes that she understands her discharge instructions and will take her prescription to the pharmacy after discharge.  Pt was transported to the main entrance via wheelchair by nurse tech at discharge and accompanied by mother, father, and daughter.   Mila Palmer , RN 04/15/2014

## 2014-04-15 NOTE — Discharge Summary (Signed)
[facility] Discharge Summary   Patient ID: JAMILEX BOHNSACK MRN: [id number] DOB/AGE: 49-Feb-1967 49 y.o.  Admit date: 04/14/2014 Discharge date: 04/15/2014  Admitting Diagnosis: Acute appendicitis without perforation  Discharge Diagnosis Patient Active Problem List   Diagnosis Date Noted  . Appendicitis 04/14/2014  . Vaginitis and vulvovaginitis 05/20/2013  . Nausea alone 05/20/2013  . Right lower quadrant abdominal pain 05/20/2013  . Essential hypertension, benign 05/20/2013  . Obesity 04/16/2013  . Palpitations 12/16/2012  . Vitamin D deficiency 12/03/2012  . HTN (hypertension) 12/02/2012  . Depression 12/02/2012  . Menopause 12/02/2012  . H/O chronic urticaria 12/02/2012  . Glaucoma 12/02/2012    Consultants None  Imaging: Ct Abdomen Pelvis W Contrast  04/14/2014   CLINICAL DATA:  Right lower quadrant pain.  EXAM: CT ABDOMEN AND PELVIS WITH CONTRAST  TECHNIQUE: Multidetector CT imaging of the abdomen and pelvis was performed using the standard protocol following bolus administration of intravenous contrast.  CONTRAST:  OMNIPAQUE IOHEXOL 300 MG/ML  SOLN  COMPARISON:  05/18/2013.  FINDINGS: Lung Bases: Dependent atelectasis noted bilaterally.  Liver:  No focal abnormality. No evidence for hepatomegaly.  Spleen: No splenomegaly. No focal mass lesion.  Stomach: Nondistended. No gastric wall thickening. No evidence of outlet obstruction.  Pancreas: No focal mass lesion. No dilatation of the main duct. No intraparenchymal cyst. No peripancreatic edema.  Gallbladder/Biliary: Gallbladder is surgically absent. No intra or extrahepatic biliary duct dilatation.  Kidneys/Adrenals: 15 mm right adrenal nodule is unchanged in the interval. Although this cannot be characterized as an adenoma based on contrast washout characteristics, the 1 year imaging stability is reassuring for a benign process and this is likely an adrenal adenoma. Right kidney is normal. Stable tiny cyst  noted in the lower pole left kidney.  Bowel Loops: Duodenum is normally positioned as is the ligament of Treitz. No evidence for small bowel dilatation or small bowel wall thickening. Terminal ileum is normal. The appendix is thickened measuring up to 15 mm in diameter. There is subtle periappendiceal edema/inflammation towards the tip of the appendix. Colon is unremarkable.  Nodes: There is no lymphadenopathy in the abdomen or pelvis.  Vasculature: No abdominal aortic aneurysm.  Pelvic Genitourinary: Bladder is unremarkable. Uterus has normal CT imaging features. No adnexal mass.  Bones/Musculoskeletal: Bone windows reveal no worrisome lytic or sclerotic osseous lesions.  Body Wall: No evidence for abdominal wall hernia.  Other: No intraperitoneal free fluid.  IMPRESSION: Dilated appendix with mild edema/ inflammation adjacent to the appendiceal tip. Imaging features are compatible with acute appendicitis. No evidence for perforation or abscess.  These test results were telephoned by me to Dr. Criss Alvine at the time of interpretation (1551 hours on 04/14/2014).   Electronically Signed   By: Kennith Center M.D.   On: 04/14/2014 15:51    Procedures Dr. Maisie Fus (04/14/14) - Laparoscopic Appendectomy  Hospital Course:  48 year old female who was in her usual state of health until approximately 4 days ago. At that point she had the gradual onset of right lower quadrant abdominal pain. Initially this was mild nagging pain but over the last 3 days has steadily increased in intensity. Yesterday it became constant. She developed nausea after eating yesterday but no vomiting. She also had some diarrhea yesterday. Today she noted fever at home and presented for evaluation. The pain is constant and aching in the right lower quadrant without any exacerbating factors. It seems to be relieved by holding an ice pack in the area. She has not bothered by  any chronic GI complaints other than some intermittent long-term  constipation.  Workup showed acute appendicitis without perforation and no leukocytosis.  Patient was admitted and underwent procedure listed above.  Tolerated procedure well and was transferred to the floor.  Diet was advanced as tolerated.  On POD#1, the patient was voiding well, tolerating diet, ambulating well, pain well controlled, vital signs stable, incisions c/d/i and felt stable for discharge home.  Patient will follow up in our office in 3 weeks and knows to call with questions or concerns.  Physical Exam: General:  Alert, NAD, pleasant, comfortable Abd:  Soft, ND, mild tenderness, incisions C/D/I    Medication List         acetaminophen 500 MG tablet  Commonly known as:  TYLENOL  Take 1,000 mg by mouth every 6 (six) hours as needed (For pain and fever.).     aspirin EC 81 MG tablet  Take 81 mg by mouth every morning.     cetirizine 10 MG tablet  Commonly known as:  ZYRTEC  Take 10 mg by mouth at bedtime.     cloNIDine 0.1 MG tablet  Commonly known as:  CATAPRES  Take 0.1 mg by mouth 2 (two) times daily.     furosemide 20 MG tablet  Commonly known as:  LASIX  Take 20 mg by mouth every morning.     latanoprost 0.005 % ophthalmic solution  Commonly known as:  XALATAN  Place 1 drop into both eyes at bedtime.     metoprolol tartrate 25 MG tablet  Commonly known as:  LOPRESSOR  Take 12.5 mg by mouth at bedtime.     oxyCODONE-acetaminophen 5-325 MG per tablet  Commonly known as:  PERCOCET/ROXICET  Take 1-2 tablets by mouth every 6 (six) hours as needed for moderate pain.     potassium chloride SA 20 MEQ tablet  Commonly known as:  K-DUR,KLOR-CON  Take 20 mEq by mouth every morning.     Vitamin D (Ergocalciferol) 50000 UNITS Caps capsule  Commonly known as:  DRISDOL  Take 50,000 Units by mouth every Wednesday.         Follow-up Information   Follow up with [facility]. Schedule an appointment as soon as possible for a visit in 3 weeks. (For  post-operation check.  Call office for your appointment time and date!)    Contact information:   [address]   [place] [address] [phone]       Follow up with [facility], MD. (For post-hospital follow up)    Specialty:  Internal Medicine   Contact information:   [address] [place] [address] [phone]       Signed: Candiss Norse [facility] [phone]  04/15/2014, 1:05 PM

## 2014-04-15 NOTE — Discharge Instructions (Signed)
Call our office to check your appointment time/date for your post-operating re-check   LAPAROSCOPIC SURGERY: POST OP INSTRUCTIONS  1. DIET: Follow a light bland diet the first 24 hours after arrival home, such as soup, liquids, crackers, etc. Be sure to include lots of fluids daily. Avoid fast food or heavy meals as your are more likely to get nauseated. Eat a low fat the next few days after surgery.  2. Take your usually prescribed home medications unless otherwise directed. 3. PAIN CONTROL:  a. Pain is best controlled by a usual combination of three different methods TOGETHER:  i. Ice/Heat ii. Over the counter pain medication iii. Prescription pain medication b. Most patients will experience some swelling and bruising around the incisions. Ice packs or heating pads (30-60 minutes up to 6 times a day) will help. Use ice for the first few days to help decrease swelling and bruising, then switch to heat to help relax tight/sore spots and speed recovery. Some people prefer to use ice alone, heat alone, alternating between ice & heat. Experiment to what works for you. Swelling and bruising can take several weeks to resolve.  c. It is helpful to take an over-the-counter pain medication regularly for the first few weeks. Choose one of the following that works best for you:  i. Naproxen (Aleve, etc) Two  tabs twice a day ii. Ibuprofen (Advil, etc) Three  tabs four times a day (every meal & bedtime) iii. Acetaminophen (Tylenol, etc) 500-650mg  four times a day (every meal & bedtime) d. A prescription for pain medication (such as oxycodone, hydrocodone, etc) should be given to you upon discharge. Take your pain medication as prescribed.  i. If you are having problems/concerns with the prescription medicine (does not control pain, nausea, vomiting, rash, itching, etc), please call us [phone] to see if we need to switch you to a different pain medicine that will work better for you and/or  control your side effect better. ii. If you need a refill on your pain medication, please contact your pharmacy. They will contact our office to request authorization. Prescriptions will not be filled after 5 pm or on week-ends. 4. Avoid getting constipated. Between the surgery and the pain medications, it is common to experience some constipation. Increasing fluid intake and taking a fiber supplement (such as Metamucil, Citrucel, FiberCon, MiraLax, etc) 1-2 times a day regularly will usually help prevent this problem from occurring. A mild laxative (prune juice, Milk of Magnesia, MiraLax, etc) should be taken according to package directions if there are no bowel movements after 48 hours.  5. Watch out for diarrhea. If you have many loose bowel movements, simplify your diet to bland foods & liquids for a few days. Stop any stool softeners and decrease your fiber supplement. Switching to mild anti-diarrheal medications (Kayopectate, Pepto Bismol) can help. If this worsens or does not improve, please call us. 6. Wash / shower every day. You may shower over the dressings as they are waterproof. Continue to shower over incision(s) after the dressing is off. 7. Remove your waterproof bandages 5 days after surgery. You may leave the incision open to air. You may replace a dressing/Band-Aid to cover the incision for comfort if you wish.  8. ACTIVITIES as tolerated:  a. You may resume regular (light) daily activities beginning the next day--such as daily self-care, walking, climbing stairs--gradually increasing activities as tolerated. If you can walk 30 minutes without difficulty, it is safe to try more intense activity such as jogging, treadmill,  bicycling, low-impact aerobics, swimming, etc. b. Save the most intensive and strenuous activity for last such as sit-ups, heavy lifting, contact sports, etc Refrain from any heavy lifting or straining until you are off narcotics for pain control.  c. DO NOT PUSH THROUGH  PAIN. Let pain be your guide: If it hurts to do something, don't do it. Pain is your body warning you to avoid that activity for another week until the pain goes down. d. You may drive when you are no longer taking prescription pain medication, you can comfortably wear a seatbelt, and you can safely maneuver your car and apply brakes. e. Bonita Quin may have sexual intercourse when it is comfortable.  9. FOLLOW UP in our office  a. Please call [facility] at [phone] to set up an appointment to see your surgeon in the office for a follow-up appointment approximately 2-3 weeks after your surgery. b. Make sure that you call for this appointment the day you arrive home to insure a convenient appointment time.      10. IF YOU HAVE DISABILITY OR FAMILY LEAVE FORMS, BRING THEM TO THE               OFFICE FOR PROCESSING.   WHEN TO CALL us [phone]:  1. Poor pain control 2. Reactions / problems with new medications (rash/itching, nausea, etc)  3. Fever over 101.5 F (38.5 C) 4. Inability to urinate 5. Nausea and/or vomiting 6. Worsening swelling or bruising 7. Continued bleeding from incision. 8. Increased pain, redness, or drainage from the incision  The clinic staff is available to answer your questions during regular business hours (8:30am-5pm). Please dont hesitate to call and ask to speak to one of our nurses for clinical concerns.  If you have a medical emergency, go to the nearest emergency room or call 911.  A surgeon from [facility] is always on call at the [facility], [place]  [address], [address], [place], [place] [address] ?  MAIN: [phone] ? TOLL FREE: [phone] ?  FAX [phone]  [email or web address]

## 2014-04-29 ENCOUNTER — Encounter: Payer: Self-pay | Admitting: *Deleted

## 2014-05-12 ENCOUNTER — Encounter: Payer: Self-pay | Admitting: Internal Medicine

## 2014-05-12 ENCOUNTER — Ambulatory Visit (INDEPENDENT_AMBULATORY_CARE_PROVIDER_SITE_OTHER): Payer: 59 | Admitting: Internal Medicine

## 2014-05-12 VITALS — BP 143/91 | HR 72 | Temp 98.5°F | Resp 15 | Wt 209.0 lb

## 2014-05-12 DIAGNOSIS — I10 Essential (primary) hypertension: Secondary | ICD-10-CM

## 2014-05-12 DIAGNOSIS — E559 Vitamin D deficiency, unspecified: Secondary | ICD-10-CM

## 2014-05-12 DIAGNOSIS — R195 Other fecal abnormalities: Secondary | ICD-10-CM

## 2014-05-12 MED ORDER — POTASSIUM CHLORIDE CRYS ER 20 MEQ PO TBCR: 20.0000 meq | EXTENDED_RELEASE_TABLET | Freq: Every morning | ORAL | Status: AC

## 2014-05-12 MED ORDER — FUROSEMIDE 20 MG PO TABS: 20.0000 mg | ORAL_TABLET | Freq: Every morning | ORAL | Status: DC

## 2014-05-12 MED ORDER — CLONIDINE HCL 0.1 MG PO TABS: 0.1000 mg | ORAL_TABLET | Freq: Two times a day (BID) | ORAL | Status: DC

## 2014-05-12 NOTE — Progress Notes (Signed)
Subjective:    Patient ID: Leah BowenYvette A Moore, female    DOB: 18-Feb-1966, 48 y.o.   MRN: [id number]  HPI  My prior OV: Chest pressure EKG No acute changes  Abd pain will get CT and labs today.  Addendum  CT shows dilated appendix no stranding. I do not have WBC count back yet I spoke with pt over phone while in Xray Will send to ER for further assessment, And surgical opinion. Pt voices understanding I spoke with Amy charge nurse and informed in ER   Today's note :  Leah Moore is here for follow up of HTN and post op appendectomy.      Doing fine toerating diet   She will be getting colonoscopy from Dr. Randa EvensEdwards when completey recovered for guaiaic post stool  Puncle had colon cancer    Allergies  Allergen Reactions  . Lisinopril Other (See Comments)    Facial Edema (Lips)    Past Medical History  Diagnosis Date  . Hypertension   . Glaucoma   . Mitral valve prolapse   . Vestibular vertigo   . GERD (gastroesophageal reflux disease)   . Headache(784.0)     migraines  . Arthritis   . H/O echocardiogram 2014   Past Surgical History  Procedure Laterality Date  . Knee arthroscopy Left     48 yrs old  . Cholecystectomy N/A 06/30/2013    Procedure: LAPAROSCOPIC CHOLECYSTECTOMY WITH INTRAOPERATIVE CHOLANGIOGRAM;  Surgeon: Clovis Puhomas A. Cornett, MD;  Location: [facility] OR;  Service: General;  Laterality: N/A;  . Laparoscopic appendectomy N/A 04/14/2014    Procedure: APPENDECTOMY LAPAROSCOPIC;  Surgeon: Romie LeveeAlicia Thomas, MD;  Location: [facility] ORS;  Service: General;  Laterality: N/A;   History   Social History  . Marital Status: Single    Spouse Name: N/A    Number of Children: N/A  . Years of Education: N/A   Occupational History  . Not on file.   Social History Main Topics  . Smoking status: Never Smoker   . Smokeless tobacco: Never Used     Comment: social alcohol  . Alcohol Use: Yes     Comment: socially  . Drug Use: No  . Sexual Activity: Yes    Birth Control/ Protection: Condom    Other Topics Concern  . Not on file   Social History Narrative  . No narrative on file   Family History  Problem Relation Age of Onset  . Arthritis Mother   . Hypertension Mother   . Diabetes Mother   . Glaucoma Mother   . COPD Mother   . Hypertension Father   . Stroke Father   . Hypertension Brother   . Glaucoma Brother   . Stroke Maternal Grandmother   . Heart disease Maternal Grandmother   . COPD Maternal Grandfather   . Heart disease Maternal Grandfather   . Cancer Maternal Grandfather     pancreatic  . Cancer Paternal Grandmother   . Hypertension Paternal Grandmother   . Arthritis Paternal Grandmother   . Heart disease Paternal Grandfather    Patient Active Problem List   Diagnosis Date Noted  . Appendicitis 04/14/2014  . Vaginitis and vulvovaginitis 05/20/2013  . Nausea alone 05/20/2013  . Right lower quadrant abdominal pain 05/20/2013  . Essential hypertension, benign 05/20/2013  . Obesity 04/16/2013  . Palpitations 12/16/2012  . Vitamin D deficiency 12/03/2012  . HTN (hypertension) 12/02/2012  . Depression 12/02/2012  . Menopause 12/02/2012  . H/O chronic urticaria 12/02/2012  . Glaucoma 12/02/2012  Current Outpatient Prescriptions on File Prior to Visit  Medication Sig Dispense Refill  . acetaminophen (TYLENOL) 500 MG tablet Take 1,000 mg by mouth every 6 (six) hours as needed (For pain and fever.).      Marland Kitchen aspirin EC 81 MG tablet Take 81 mg by mouth every morning.      . cetirizine (ZYRTEC) 10 MG tablet Take 10 mg by mouth at bedtime.       . cloNIDine (CATAPRES) 0.1 MG tablet Take 0.1 mg by mouth 2 (two) times daily.      . furosemide (LASIX) 20 MG tablet Take 20 mg by mouth every morning.      . latanoprost (XALATAN) 0.005 % ophthalmic solution Place 1 drop into both eyes at bedtime.       . metoprolol tartrate (LOPRESSOR) 25 MG tablet Take 12.5 mg by mouth at bedtime.       Marland Kitchen oxyCODONE-acetaminophen (PERCOCET/ROXICET) 5-325 MG per tablet Take 1-2  tablets by mouth every 6 (six) hours as needed for moderate pain.  40 tablet  0  . potassium chloride SA (K-DUR,KLOR-CON) 20 MEQ tablet Take 20 mEq by mouth every morning.      . Vitamin D, Ergocalciferol, (DRISDOL) 50000 UNITS CAPS capsule Take 50,000 Units by mouth every Wednesday.       No current facility-administered medications on file prior to visit.      Review of Systems    see HPI Objective:   Physical Exam Physical Exam  Nursing note and vitals reviewed.  Constitutional: She is oriented to person, place, and time. She appears well-developed and well-nourished.  HENT:  Head: Normocephalic and atraumatic.  Cardiovascular: Normal rate and regular rhythm. Exam reveals no gallop and no friction rub.  No murmur heard.  Pulmonary/Chest: Breath sounds normal. She has no wheezes. She has no rales.  Abd  Soft NT/ND   Well healed lapatotomy scars Neurological: She is alert and oriented to person, place, and time.  Skin: Skin is warm and dry.  Psychiatric: She has a normal mood and affect. Her behavior is normal.        Assessment & Plan:  HTN  Reorder meds  S/P appy  Healing incisions  Guaiac pos stool  Pending GI eval with colonoscopy

## 2014-05-13 ENCOUNTER — Encounter: Payer: Self-pay | Admitting: Internal Medicine

## 2014-05-13 LAB — LIPID PANEL
CHOL/HDL RATIO: 2.8 ratio
CHOLESTEROL: 151 mg/dL (ref 0–200)
HDL: 53 mg/dL (ref >39)
LDL Cholesterol: 69 mg/dL (ref 0–99)
TRIGLYCERIDES: 146 mg/dL (ref <150)
VLDL: 29 mg/dL (ref 0–40)

## 2014-05-13 LAB — VITAMIN D 25 HYDROXY (VIT D DEFICIENCY, FRACTURES): Vit D, 25-Hydroxy: 25 ng/mL — ABNORMAL LOW (ref 30–89)

## 2014-05-13 LAB — TSH: TSH: 0.708 u[IU]/mL (ref 0.350–4.500)

## 2014-05-17 ENCOUNTER — Other Ambulatory Visit: Payer: Self-pay | Admitting: Internal Medicine

## 2014-05-17 DIAGNOSIS — Z1231 Encounter for screening mammogram for malignant neoplasm of breast: Secondary | ICD-10-CM

## 2014-05-18 ENCOUNTER — Ambulatory Visit (HOSPITAL_BASED_OUTPATIENT_CLINIC_OR_DEPARTMENT_OTHER)
Admission: RE | Admit: 2014-05-18 | Discharge: 2014-05-18 | Disposition: A | Discharge status: Home / Self Care | Payer: 59 | Source: Ambulatory Visit | Attending: Internal Medicine | Admitting: Internal Medicine

## 2014-05-18 DIAGNOSIS — Z1231 Encounter for screening mammogram for malignant neoplasm of breast: Secondary | ICD-10-CM | POA: Diagnosis present

## 2014-06-07 ENCOUNTER — Encounter: Payer: Self-pay | Admitting: Internal Medicine

## 2014-09-15 ENCOUNTER — Other Ambulatory Visit: Payer: Self-pay | Admitting: *Deleted

## 2014-09-15 MED ORDER — METOPROLOL TARTRATE 25 MG PO TABS: 12.5000 mg | ORAL_TABLET | Freq: Every day | ORAL | Status: DC

## 2014-09-15 NOTE — Telephone Encounter (Signed)
Refill request

## 2014-09-29 ENCOUNTER — Ambulatory Visit (INDEPENDENT_AMBULATORY_CARE_PROVIDER_SITE_OTHER): Payer: 59 | Admitting: Internal Medicine

## 2014-09-29 ENCOUNTER — Encounter: Payer: Self-pay | Admitting: *Deleted

## 2014-09-29 ENCOUNTER — Encounter: Payer: 59 | Admitting: Internal Medicine

## 2014-09-29 ENCOUNTER — Encounter: Payer: Self-pay | Admitting: Internal Medicine

## 2014-09-29 VITALS — BP 138/84 | HR 80 | Resp 16 | Ht 68.0 in | Wt 218.0 lb

## 2014-09-29 DIAGNOSIS — Z0000 Encounter for general adult medical examination without abnormal findings: Secondary | ICD-10-CM

## 2014-09-29 LAB — POCT URINALYSIS DIPSTICK
Bilirubin, UA: NEGATIVE
Blood, UA: NEGATIVE
Glucose, UA: NEGATIVE
Ketones, UA: NEGATIVE
Leukocytes, UA: NEGATIVE
NITRITE UA: NEGATIVE
PH UA: 6.5
Protein, UA: NEGATIVE
SPEC GRAV UA: 1.01
UROBILINOGEN UA: NEGATIVE

## 2014-09-29 NOTE — Progress Notes (Signed)
Subjective:    Patient ID: Leah Moore, female    DOB: 06-24-1966, 49 y.o.   MRN: [id number]  HPI  05/2014 note Assessment & Plan:  HTN Reorder meds  S/P appy Healing incisions  Guaiac pos stool Pending GI eval with colonoscopy          TODAY  [name] is here for CPE  HM: had colonoscopy last week  Dr. Randa Evens,  UTD with mm   Pt is a nonsmoker.   Pap per GYN  Still  Has some cramping type abd pain in Right lower quadrant that has been present for over one year.  She is S/P appy.  No N/V/D no relation to food no dysuria no vaginal discharge .  Unclear if ovarian cyst in past.  Pt states she would like to see her GYN  HTN:  She had decreased her clonidine to once a day to see if hot flushes improved   Menopausal hot flushes no clonidine for both vasomotor flushes and BP control  Allergies  Allergen Reactions  . Lisinopril Other (See Comments)    Facial Edema (Lips)    Past Medical History  Diagnosis Date  . Hypertension   . Glaucoma   . Mitral valve prolapse   . Vestibular vertigo   . GERD (gastroesophageal reflux disease)   . Headache(784.0)     migraines  . Arthritis   . H/O echocardiogram 2014   Past Surgical History  Procedure Laterality Date  . Knee arthroscopy Left     49 yrs old  . Cholecystectomy N/A 06/30/2013    Procedure: LAPAROSCOPIC CHOLECYSTECTOMY WITH INTRAOPERATIVE CHOLANGIOGRAM;  Surgeon: Clovis Pu. Cornett, MD;  Location: [facility];  Service: General;  Laterality: N/A;  . Laparoscopic appendectomy N/A 04/14/2014    Procedure: APPENDECTOMY LAPAROSCOPIC;  Surgeon: Romie Levee, MD;  Location: [facility];  Service: General;  Laterality: N/A;   History   Social History  . Marital Status: Single    Spouse Name: N/A  . Number of Children: N/A  . Years of Education: N/A   Occupational History  . Not on file.   Social History Main Topics  . Smoking status: Never Smoker   . Smokeless tobacco: Never Used     Comment: social alcohol  .  Alcohol Use: Yes     Comment: socially  . Drug Use: No  . Sexual Activity: Yes    Birth Control/ Protection: Condom   Other Topics Concern  . Not on file   Social History Narrative   Family History  Problem Relation Age of Onset  . Arthritis Mother   . Hypertension Mother   . Diabetes Mother   . Glaucoma Mother   . COPD Mother   . Hypertension Father   . Stroke Father   . Hypertension Brother   . Glaucoma Brother   . Stroke Maternal Grandmother   . Heart disease Maternal Grandmother   . COPD Maternal Grandfather   . Heart disease Maternal Grandfather   . Cancer Maternal Grandfather     pancreatic  . Cancer Paternal Grandmother   . Hypertension Paternal Grandmother   . Arthritis Paternal Grandmother   . Heart disease Paternal Grandfather    Patient Active Problem List   Diagnosis Date Noted  . Appendicitis 04/14/2014  . Vaginitis and vulvovaginitis 05/20/2013  . Nausea alone 05/20/2013  . Right lower quadrant abdominal pain 05/20/2013  . Essential hypertension, benign 05/20/2013  . Obesity 04/16/2013  . Palpitations 12/16/2012  . Vitamin D  deficiency 12/03/2012  . HTN (hypertension) 12/02/2012  . Depression 12/02/2012  . Menopause 12/02/2012  . H/O chronic urticaria 12/02/2012  . Glaucoma 12/02/2012   Current Outpatient Prescriptions on File Prior to Visit  Medication Sig Dispense Refill  . aspirin EC 81 MG tablet Take 81 mg by mouth every morning.    . cetirizine (ZYRTEC) 10 MG tablet Take 10 mg by mouth at bedtime.     . cloNIDine (CATAPRES) 0.1 MG tablet Take 1 tablet (0.1 mg total) by mouth 2 (two) times daily. 60 tablet 5  . furosemide (LASIX) 20 MG tablet Take 1 tablet (20 mg total) by mouth every morning. 30 tablet 5  . latanoprost (XALATAN) 0.005 % ophthalmic solution Place 1 drop into both eyes at bedtime.     . metoprolol tartrate (LOPRESSOR) 25 MG tablet Take 0.5 tablets (12.5 mg total) by mouth at bedtime. 90 tablet 1  . potassium chloride SA  (K-DUR,KLOR-CON) 20 MEQ tablet Take 1 tablet (20 mEq total) by mouth every morning. 30 tablet 5  . Vitamin D, Ergocalciferol, (DRISDOL) 50000 UNITS CAPS capsule Take 50,000 Units by mouth every Wednesday.     No current facility-administered medications on file prior to visit.      Review of Systems  Respiratory: Negative for cough, chest tightness, shortness of breath and wheezing.   Cardiovascular: Negative for chest pain, palpitations and leg swelling.  All other systems reviewed and are negative.      Objective:   Physical Exam  Physical Exam  Nursing note and vitals reviewed.  Constitutional: She is oriented to person, place, and time. She appears well-developed and well-nourished.  HENT:  Head: Normocephalic and atraumatic.  Right Ear: Tympanic membrane and ear canal normal. No drainage. Tympanic membrane is not injected and not erythematous.  Left Ear: Tympanic membrane and ear canal normal. No drainage. Tympanic membrane is not injected and not erythematous.  Nose: Nose normal. Right sinus exhibits no maxillary sinus tenderness and no frontal sinus tenderness. Left sinus exhibits no maxillary sinus tenderness and no frontal sinus tenderness.  Mouth/Throat: Oropharynx is clear and moist. No oral lesions. No oropharyngeal exudate.  Defect noted of hard palate ?? congenital Eyes: Conjunctivae and EOM are normal. Pupils are equal, round, and reactive to light.  Neck: Normal range of motion. Neck supple. No JVD present. Carotid bruit is not present. No mass and no thyromegaly present.  Cardiovascular: Normal rate, regular rhythm, S1 normal, S2 normal and intact distal pulses. Exam reveals no gallop and no friction rub.  No murmur heard.  Pulses:  Carotid pulses are 2+ on the right side, and 2+ on the left side.  Dorsalis pedis pulses are 2+ on the right side, and 2+ on the left side.  No carotid bruit. No LE edema  Pulmonary/Chest: Breath sounds normal. She has no wheezes. She  has no rales. She exhibits no tenderness.  Breast no discrete mass no nipple discharge no axillary adenopathy Abdominal: Soft. Bowel sounds are normal. She exhibits no distension and no mass. There is no hepatosplenomegaly. There is no tenderness. There is no CVA tenderness.  Rectal deferred (recent colonoscopy)  Musculoskeletal: Normal range of motion.  No active synovitis to joints.  Lymphadenopathy:  She has no cervical adenopathy.  She has no axillary adenopathy.  Right: No inguinal and no supraclavicular adenopathy present.  Left: No inguinal and no supraclavicular adenopathy present.  Neurological: She is alert and oriented to person, place, and time. She has normal strength and normal reflexes.  She displays no tremor. No cranial nerve deficit or sensory deficit. Coordination and gait normal.  Skin: Skin is warm and dry. No rash noted. No cyanosis. Nails show no clubbing.  Psychiatric: She has a normal mood and affect. Her speech is normal and behavior is normal. Cognition and memory are normal.          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  Pt has had this evaluated in the past.  Her sibling also has same defect

## 2014-10-01 ENCOUNTER — Other Ambulatory Visit (HOSPITAL_COMMUNITY): Payer: Self-pay | Admitting: Obstetrics and Gynecology

## 2014-10-01 DIAGNOSIS — R1031 Right lower quadrant pain: Secondary | ICD-10-CM

## 2014-10-05 ENCOUNTER — Ambulatory Visit (HOSPITAL_COMMUNITY)
Admission: RE | Admit: 2014-10-05 | Discharge: 2014-10-05 | Disposition: A | Discharge status: Home / Self Care | Payer: 59 | Source: Ambulatory Visit | Attending: Obstetrics and Gynecology | Admitting: Obstetrics and Gynecology

## 2014-10-05 DIAGNOSIS — Z9049 Acquired absence of other specified parts of digestive tract: Secondary | ICD-10-CM | POA: Insufficient documentation

## 2014-10-05 DIAGNOSIS — R1031 Right lower quadrant pain: Secondary | ICD-10-CM | POA: Diagnosis not present

## 2014-10-05 DIAGNOSIS — J8410 Pulmonary fibrosis, unspecified: Secondary | ICD-10-CM | POA: Insufficient documentation

## 2014-10-05 DIAGNOSIS — E279 Disorder of adrenal gland, unspecified: Secondary | ICD-10-CM | POA: Diagnosis not present

## 2014-10-05 MED ORDER — IOHEXOL 300 MG/ML  SOLN
100.0000 mL | Freq: Once | INTRAMUSCULAR | Status: AC | PRN
  Administered 2014-10-05: 100 mL via INTRAVENOUS

## 2014-10-18 ENCOUNTER — Encounter: Payer: Self-pay | Admitting: *Deleted

## 2014-11-22 ENCOUNTER — Encounter: Payer: Self-pay | Admitting: *Deleted

## 2015-05-04 ENCOUNTER — Other Ambulatory Visit (HOSPITAL_BASED_OUTPATIENT_CLINIC_OR_DEPARTMENT_OTHER): Payer: Self-pay | Admitting: Obstetrics and Gynecology

## 2015-05-04 DIAGNOSIS — Z1231 Encounter for screening mammogram for malignant neoplasm of breast: Secondary | ICD-10-CM

## 2015-05-12 ENCOUNTER — Ambulatory Visit (HOSPITAL_BASED_OUTPATIENT_CLINIC_OR_DEPARTMENT_OTHER)
Admission: RE | Admit: 2015-05-12 | Discharge: 2015-05-12 | Disposition: A | Discharge status: Home / Self Care | Payer: 59 | Source: Ambulatory Visit | Attending: Obstetrics and Gynecology | Admitting: Obstetrics and Gynecology

## 2015-05-12 ENCOUNTER — Other Ambulatory Visit (HOSPITAL_BASED_OUTPATIENT_CLINIC_OR_DEPARTMENT_OTHER): Payer: Self-pay | Admitting: Family Medicine

## 2015-05-12 DIAGNOSIS — Z1231 Encounter for screening mammogram for malignant neoplasm of breast: Secondary | ICD-10-CM | POA: Insufficient documentation

## 2015-08-22 MED FILL — SERTRALINE HCL 100 MG TAB: 100 | 30 days supply | Qty: 45 | Fill #1

## 2015-10-14 MED FILL — SERTRALINE HCL 50 MG TABLET: 50 | 90 days supply | Qty: 90 | Fill #0

## 2015-10-14 MED FILL — cloNIDine HCL 0.1 MG TABS: 0.1 | 90 days supply | Qty: 180 | Fill #0

## 2015-10-14 MED FILL — FUROSEMIDE 20 MG TABLET: 20 | 90 days supply | Qty: 90 | Fill #0

## 2016-01-24 MED FILL — SERTRALINE HCL 50 MG TABLET: 50 | 90 days supply | Qty: 90 | Fill #1

## 2016-05-07 ENCOUNTER — Other Ambulatory Visit (HOSPITAL_BASED_OUTPATIENT_CLINIC_OR_DEPARTMENT_OTHER): Payer: Self-pay | Admitting: Family Medicine

## 2016-05-07 DIAGNOSIS — Z1231 Encounter for screening mammogram for malignant neoplasm of breast: Secondary | ICD-10-CM

## 2016-05-09 ENCOUNTER — Ambulatory Visit (HOSPITAL_BASED_OUTPATIENT_CLINIC_OR_DEPARTMENT_OTHER)
Admission: RE | Admit: 2016-05-09 | Discharge: 2016-05-09 | Disposition: A | Discharge status: Home / Self Care | Payer: 59 | Source: Ambulatory Visit | Attending: Family Medicine | Admitting: Family Medicine

## 2016-05-09 DIAGNOSIS — Z1231 Encounter for screening mammogram for malignant neoplasm of breast: Secondary | ICD-10-CM | POA: Diagnosis not present

## 2016-05-15 MED FILL — SERTRALINE HCL 50 MG TABLET: 50 | 90 days supply | Qty: 90 | Fill #2

## 2016-08-31 MED FILL — SERTRALINE HCL 50 MG TABLET: 50 | 90 days supply | Qty: 90 | Fill #3

## 2016-09-30 DIAGNOSIS — S93601A Unspecified sprain of right foot, initial encounter: Secondary | ICD-10-CM | POA: Diagnosis not present

## 2016-10-08 DIAGNOSIS — S93601D Unspecified sprain of right foot, subsequent encounter: Secondary | ICD-10-CM | POA: Diagnosis not present

## 2016-10-25 DIAGNOSIS — S93601D Unspecified sprain of right foot, subsequent encounter: Secondary | ICD-10-CM | POA: Diagnosis not present

## 2016-10-31 DIAGNOSIS — S93491D Sprain of other ligament of right ankle, subsequent encounter: Secondary | ICD-10-CM | POA: Diagnosis not present

## 2016-10-31 DIAGNOSIS — M25671 Stiffness of right ankle, not elsewhere classified: Secondary | ICD-10-CM | POA: Diagnosis not present

## 2016-10-31 DIAGNOSIS — M25571 Pain in right ankle and joints of right foot: Secondary | ICD-10-CM | POA: Diagnosis not present

## 2016-11-13 DIAGNOSIS — I10 Essential (primary) hypertension: Secondary | ICD-10-CM | POA: Diagnosis not present

## 2016-11-13 DIAGNOSIS — N951 Menopausal and female climacteric states: Secondary | ICD-10-CM | POA: Diagnosis not present

## 2016-11-13 DIAGNOSIS — J309 Allergic rhinitis, unspecified: Secondary | ICD-10-CM | POA: Diagnosis not present

## 2016-12-25 DIAGNOSIS — Z01419 Encounter for gynecological examination (general) (routine) without abnormal findings: Secondary | ICD-10-CM | POA: Diagnosis not present

## 2017-01-04 MED FILL — POTASSIUM CL ER 20 MEQ TABL: 20 | 90 days supply | Qty: 90 | Fill #0

## 2017-01-04 MED FILL — SERTRALINE HCL 50 MG TABLET: 50 | 90 days supply | Qty: 90 | Fill #0

## 2017-01-04 MED FILL — cloNIDine HCL 0.1 MG TABS: 0.1 | 90 days supply | Qty: 180 | Fill #0

## 2017-01-04 MED FILL — FUROSEMIDE 20 MG TABLET: 20 | 90 days supply | Qty: 90 | Fill #0

## 2017-05-27 DIAGNOSIS — Z13820 Encounter for screening for osteoporosis: Secondary | ICD-10-CM | POA: Diagnosis not present

## 2017-06-13 MED FILL — SERTRALINE HCL 50 MG TABLET: 50 | 30 days supply | Qty: 30 | Fill #1

## 2017-07-23 MED FILL — SERTRALINE HCL 50 MG TABLET: 50 | 30 days supply | Qty: 30 | Fill #2

## 2017-09-12 MED FILL — SERTRALINE HCL 50 MG TABLET: 50 | 30 days supply | Qty: 30 | Fill #3

## 2017-09-17 DIAGNOSIS — H04123 Dry eye syndrome of bilateral lacrimal glands: Secondary | ICD-10-CM | POA: Diagnosis not present

## 2017-09-17 DIAGNOSIS — H401123 Primary open-angle glaucoma, left eye, severe stage: Secondary | ICD-10-CM | POA: Diagnosis not present

## 2017-09-17 DIAGNOSIS — H2513 Age-related nuclear cataract, bilateral: Secondary | ICD-10-CM | POA: Diagnosis not present

## 2017-09-17 MED FILL — COMBIGAN EYE DROPS: 0.2-0.5 | 31 days supply | Qty: 5 | Fill #0

## 2017-09-17 MED FILL — LATANOPROST 0.005% EYE DRP: 0.005 | 25 days supply | Qty: 3 | Fill #0

## 2017-10-21 DIAGNOSIS — I10 Essential (primary) hypertension: Secondary | ICD-10-CM | POA: Diagnosis not present

## 2017-10-21 DIAGNOSIS — H401113 Primary open-angle glaucoma, right eye, severe stage: Secondary | ICD-10-CM | POA: Diagnosis not present

## 2017-10-21 DIAGNOSIS — N951 Menopausal and female climacteric states: Secondary | ICD-10-CM | POA: Diagnosis not present

## 2017-10-29 MED FILL — COMBIGAN EYE DROPS: 0.2-0.5 | 31 days supply | Qty: 5 | Fill #1

## 2017-10-29 MED FILL — LATANOPROST 0.005% EYE DRP: 0.005 | 25 days supply | Qty: 3 | Fill #1

## 2017-11-04 DIAGNOSIS — H401123 Primary open-angle glaucoma, left eye, severe stage: Secondary | ICD-10-CM | POA: Diagnosis not present

## 2017-11-14 MED FILL — SERTRALINE HCL 50 MG TABLET: 50 | 30 days supply | Qty: 30 | Fill #0

## 2017-11-18 DIAGNOSIS — H401133 Primary open-angle glaucoma, bilateral, severe stage: Secondary | ICD-10-CM | POA: Diagnosis not present

## 2017-12-09 MED FILL — LATANOPROST 0.005% EYE DRP: 0.005 | 25 days supply | Qty: 3 | Fill #2

## 2017-12-09 MED FILL — COMBIGAN EYE DROPS: 0.2-0.5 | 31 days supply | Qty: 5 | Fill #2

## 2017-12-19 DIAGNOSIS — H401133 Primary open-angle glaucoma, bilateral, severe stage: Secondary | ICD-10-CM | POA: Diagnosis not present

## 2017-12-20 MED FILL — cloNIDine HCL 0.1 MG TABS: 0.1 | 30 days supply | Qty: 60 | Fill #0

## 2017-12-20 MED FILL — FUROSEMIDE 20 MG TAB: 20 | 30 days supply | Qty: 30 | Fill #0

## 2018-01-07 MED FILL — SERTRALINE HCL 50 MG TABLET: 50 | 30 days supply | Qty: 30 | Fill #1

## 2018-02-17 MED FILL — cloNIDine HCL 0.1 MG TABS: 0.1 | 30 days supply | Qty: 60 | Fill #1

## 2018-02-17 MED FILL — SERTRALINE HCL 50 MG TABLET: 50 | 30 days supply | Qty: 30 | Fill #2

## 2018-02-17 MED FILL — FUROSEMIDE 20 MG TAB: 20 | 30 days supply | Qty: 30 | Fill #1

## 2018-02-17 MED FILL — COMBIGAN EYE DROPS: 0.2-0.5 | 31 days supply | Qty: 5 | Fill #3

## 2018-02-18 DIAGNOSIS — H401133 Primary open-angle glaucoma, bilateral, severe stage: Secondary | ICD-10-CM | POA: Diagnosis not present

## 2018-02-24 MED FILL — LATANOPROST 0.005% EYE DRP: 0.005 | 25 days supply | Qty: 3 | Fill #0

## 2018-04-09 DIAGNOSIS — B029 Zoster without complications: Secondary | ICD-10-CM | POA: Diagnosis not present

## 2018-04-10 MED FILL — SERTRALINE HCL 50 MG TABLET: 50 | 30 days supply | Qty: 30 | Fill #3

## 2018-04-23 DIAGNOSIS — Z23 Encounter for immunization: Secondary | ICD-10-CM | POA: Diagnosis not present

## 2018-04-23 DIAGNOSIS — N951 Menopausal and female climacteric states: Secondary | ICD-10-CM | POA: Diagnosis not present

## 2018-04-23 DIAGNOSIS — Z0000 Encounter for general adult medical examination without abnormal findings: Secondary | ICD-10-CM | POA: Diagnosis not present

## 2018-04-23 DIAGNOSIS — I10 Essential (primary) hypertension: Secondary | ICD-10-CM | POA: Diagnosis not present

## 2018-05-23 MED FILL — COMBIGAN EYE DROPS: 0.2-0.5 | 31 days supply | Qty: 5 | Fill #4

## 2018-05-23 MED FILL — cloNIDine HCL 0.1 MG TABS: 0.1 | 30 days supply | Qty: 60 | Fill #2

## 2018-05-23 MED FILL — AMLODIPINE BESYLATE 10 MG T: 10 | 30 days supply | Qty: 30 | Fill #0

## 2018-05-23 MED FILL — SERTRALINE HCL 50 MG TABLET: 50 | 30 days supply | Qty: 30 | Fill #4

## 2018-05-23 MED FILL — LATANOPROST 0.005% EYE DRP: 0.005 | 25 days supply | Qty: 3 | Fill #1

## 2018-05-23 MED FILL — FUROSEMIDE 20 MG TAB: 20 | 30 days supply | Qty: 30 | Fill #2

## 2018-06-19 DIAGNOSIS — H469 Unspecified optic neuritis: Secondary | ICD-10-CM | POA: Diagnosis not present

## 2018-06-25 DIAGNOSIS — I10 Essential (primary) hypertension: Secondary | ICD-10-CM | POA: Diagnosis not present

## 2018-07-08 MED FILL — FUROSEMIDE 20 MG TAB: 20 | 30 days supply | Qty: 30 | Fill #3

## 2018-07-08 MED FILL — METOPROLOL SUCCINATE ER 50: 50 | 30 days supply | Qty: 30 | Fill #0

## 2018-07-08 MED FILL — SERTRALINE HCL 50 MG TABLET: 50 | 30 days supply | Qty: 30 | Fill #5

## 2018-07-08 MED FILL — AMLODIPINE BESYLATE 10 MG T: 10 | 30 days supply | Qty: 30 | Fill #0

## 2018-07-14 ENCOUNTER — Other Ambulatory Visit: Payer: Self-pay | Admitting: Ophthalmology

## 2018-07-14 DIAGNOSIS — H469 Unspecified optic neuritis: Secondary | ICD-10-CM

## 2018-07-21 ENCOUNTER — Other Ambulatory Visit: Payer: Self-pay | Admitting: Ophthalmology

## 2018-07-21 DIAGNOSIS — H469 Unspecified optic neuritis: Secondary | ICD-10-CM

## 2018-07-22 ENCOUNTER — Other Ambulatory Visit: Payer: 59

## 2018-07-22 ENCOUNTER — Ambulatory Visit
Admission: RE | Admit: 2018-07-22 | Discharge: 2018-07-22 | Disposition: A | Discharge status: Home / Self Care | Payer: 59 | Source: Ambulatory Visit | Attending: Ophthalmology | Admitting: Ophthalmology

## 2018-07-22 DIAGNOSIS — H469 Unspecified optic neuritis: Secondary | ICD-10-CM

## 2018-07-22 MED ORDER — GADOBENATE DIMEGLUMINE 529 MG/ML IV SOLN
20.0000 mL | Freq: Once | INTRAVENOUS | Status: AC | PRN
  Administered 2018-07-22: 20 mL via INTRAVENOUS

## 2018-07-29 ENCOUNTER — Other Ambulatory Visit: Payer: Self-pay

## 2018-08-04 DIAGNOSIS — J019 Acute sinusitis, unspecified: Secondary | ICD-10-CM | POA: Diagnosis not present

## 2018-08-04 DIAGNOSIS — I10 Essential (primary) hypertension: Secondary | ICD-10-CM | POA: Diagnosis not present

## 2018-08-04 DIAGNOSIS — R05 Cough: Secondary | ICD-10-CM | POA: Diagnosis not present

## 2018-08-04 MED FILL — AMLODIPINE BESYLATE 10 MG T: 10 | 30 days supply | Qty: 30 | Fill #0

## 2018-08-04 MED FILL — KAPSPARGO SPRINKLE 50 MG CP: 50 | 30 days supply | Qty: 30 | Fill #0

## 2018-08-08 DIAGNOSIS — H269 Unspecified cataract: Secondary | ICD-10-CM | POA: Diagnosis not present

## 2018-08-14 MED FILL — COMBIGAN EYE DROPS: 0.2-0.5 | 31 days supply | Qty: 5 | Fill #5

## 2018-08-14 MED FILL — cloNIDine HCL 0.1 MG TABS: 0.1 | 30 days supply | Qty: 60 | Fill #3

## 2018-08-14 MED FILL — LATANOPROST 0.005% EYE DRP: 0.005 | 25 days supply | Qty: 3 | Fill #2

## 2018-08-14 MED FILL — METOPROLOL SUCCINATE ER 50: 50 | 30 days supply | Qty: 30 | Fill #0

## 2018-08-14 MED FILL — AMLODIPINE BESYLATE 10 MG T: 10 | 30 days supply | Qty: 30 | Fill #0

## 2018-08-14 MED FILL — SERTRALINE HCL 50 MG TABLET: 50 | 30 days supply | Qty: 30 | Fill #0

## 2018-08-14 MED FILL — FUROSEMIDE 20 MG TAB: 20 | 30 days supply | Qty: 30 | Fill #4

## 2018-08-20 DIAGNOSIS — Z8042 Family history of malignant neoplasm of prostate: Secondary | ICD-10-CM | POA: Diagnosis not present

## 2018-08-20 DIAGNOSIS — Z01419 Encounter for gynecological examination (general) (routine) without abnormal findings: Secondary | ICD-10-CM | POA: Diagnosis not present

## 2018-08-20 DIAGNOSIS — Z808 Family history of malignant neoplasm of other organs or systems: Secondary | ICD-10-CM | POA: Diagnosis not present

## 2018-08-20 DIAGNOSIS — Z1231 Encounter for screening mammogram for malignant neoplasm of breast: Secondary | ICD-10-CM | POA: Diagnosis not present

## 2018-08-20 DIAGNOSIS — Z6832 Body mass index (BMI) 32.0-32.9, adult: Secondary | ICD-10-CM | POA: Diagnosis not present

## 2018-08-20 DIAGNOSIS — Z8051 Family history of malignant neoplasm of kidney: Secondary | ICD-10-CM | POA: Diagnosis not present

## 2018-09-02 ENCOUNTER — Telehealth: Payer: Self-pay | Admitting: Genetics

## 2018-09-02 ENCOUNTER — Encounter: Payer: Self-pay | Admitting: Genetics

## 2018-09-02 NOTE — Telephone Encounter (Signed)
Received a genetic counseling referral from Dr. Mitchel Honour. Pt has been cld and scheduled to see Darral Dash on 2/11 at 10am. Pt aware to arrive 30 minutes early. Letter mailed.

## 2018-09-03 ENCOUNTER — Institutional Professional Consult (permissible substitution): Payer: 59 | Admitting: Pulmonary Disease

## 2018-09-15 ENCOUNTER — Institutional Professional Consult (permissible substitution): Payer: 59 | Admitting: Pulmonary Disease

## 2018-09-16 ENCOUNTER — Inpatient Hospital Stay: Payer: 59

## 2018-09-16 ENCOUNTER — Encounter: Payer: Self-pay | Admitting: Genetics

## 2018-09-16 ENCOUNTER — Inpatient Hospital Stay: Payer: 59 | Attending: Genetic Counselor | Admitting: Genetics

## 2018-09-16 DIAGNOSIS — Z803 Family history of malignant neoplasm of breast: Secondary | ICD-10-CM | POA: Diagnosis not present

## 2018-09-16 DIAGNOSIS — Z800 Family history of malignant neoplasm of digestive organs: Secondary | ICD-10-CM

## 2018-09-16 NOTE — Progress Notes (Signed)
REFERRING PROVIDER: Linda Hedges, [facility], [address], [place], [place] [address]  PRIMARY PROVIDER:  Coralyn Mark, Altamese Cabal, MD  PRIMARY REASON FOR VISIT:  1. Family history of colon cancer   2. Family history of breast cancer   3. Family history of pancreatic cancer   4. Family history of stomach cancer     HISTORY OF PRESENT ILLNESS:   Leah Moore, a 53 y.o. female, was seen for a [place] cancer genetics consultation at the request of Dr. Lynnette Caffey due to a family history of cancer.  Leah Moore presents to clinic today to discuss the possibility of a hereditary predisposition to cancer, genetic testing, and to further clarify her future cancer risks, as well as potential cancer risks for family members.   Leah Moore is a 53 y.o. female with no personal history of cancer.    HORMONAL RISK FACTORS:  Menarche was at age 10.  First live birth at age 53.  Ovaries intact: yes.  Hysterectomy: no.  Menopausal status: currently in menopause.  HRT use: 0 years. Colonoscopy: yes; reportedly no concerns, next c-scope recommended 5 years.  Past Medical History:  Diagnosis Date  . Arthritis   . Family history of breast cancer   . Family history of colon cancer   . Family history of colon cancer   . Family history of pancreatic cancer   . Family history of stomach cancer   . GERD (gastroesophageal reflux disease)   . Glaucoma   . H/O echocardiogram 2014  . Headache(784.0)    migraines  . Hypertension   . Mitral valve prolapse   . Vestibular vertigo     Past Surgical History:  Procedure Laterality Date  . CHOLECYSTECTOMY N/A 06/30/2013   Procedure: LAPAROSCOPIC CHOLECYSTECTOMY WITH INTRAOPERATIVE CHOLANGIOGRAM;  Surgeon: Joyice Faster. Cornett, MD;  Location: [facility];  Service: General;  Laterality: N/A;  . KNEE ARTHROSCOPY Left    53 yrs old  . LAPAROSCOPIC APPENDECTOMY N/A 04/14/2014   Procedure: APPENDECTOMY LAPAROSCOPIC;  Surgeon: Leighton Ruff, MD;   Location: [facility];  Service: General;  Laterality: N/A;    Social History   Socioeconomic History  . Marital status: Single    Spouse name: Not on file  . Number of children: Not on file  . Years of education: Not on file  . Highest education level: Not on file  Occupational History  . Not on file  Social Needs  . Financial resource strain: Not on file  . Food insecurity:    Worry: Not on file    Inability: Not on file  . Transportation needs:    Medical: Not on file    Non-medical: Not on file  Tobacco Use  . Smoking status: Never Smoker  . Smokeless tobacco: Never Used  . Tobacco comment: social alcohol  Substance and Sexual Activity  . Alcohol use: Yes    Comment: socially  . Drug use: No  . Sexual activity: Yes    Birth control/protection: Condom  Lifestyle  . Physical activity:    Days per week: Not on file    Minutes per session: Not on file  . Stress: Not on file  Relationships  . Social connections:    Talks on phone: Not on file    Gets together: Not on file    Attends religious service: Not on file    Active member of club or organization: Not on file    Attends meetings of clubs or  organizations: Not on file    Relationship status: Not on file  Other Topics Concern  . Not on file  Social History Narrative  . Not on file     FAMILY HISTORY:  We obtained a detailed, 4-generation family history.  Significant diagnoses are listed below: Family History  Problem Relation Age of Onset  . Arthritis Mother   . Hypertension Mother   . Diabetes Mother   . Glaucoma Mother   . COPD Mother   . Hypertension Father   . Stroke Father   . Hypertension Brother   . Glaucoma Brother   . Stroke Maternal Grandmother   . Heart disease Maternal Grandmother   . COPD Maternal Grandfather   . Heart disease Maternal Grandfather   . Hypertension Paternal Grandmother   . Arthritis Paternal Grandmother   . Pancreatic cancer Paternal Grandmother   . Heart disease  Paternal Grandfather   . Colon cancer Paternal Uncle   . Kidney cancer Paternal Uncle   . Prostate cancer Paternal Uncle   . Stomach cancer Other   . Prostate cancer Other   . Lung cancer Other     Leah Moore has a 61 year-old daughter with no hx of cancer. She has 2 brothers who are 65 and 31 with no hx of cancer.   Leah Moore father: 77, no hx of cancer.  Paternal Aunts/Uncles: 2 paternal uncles- 62 had kidney and prostate cancer dx over 29 and the other had colon cancer dx over the age of 18.   Paternal cousins: no hx of cancer Paternal grandfather: died of respiratory/heart disease.  He had a sister who had stomah cancer, a brother with prostate cancer and a brother with lung cancer.  Paternal grandmother:died of pancreatic cancer dx over the age of 50.    Leah Moore's mother: 76, no hx of cancer.  Maternal Aunts/Uncles: 2 maternal aunts with no hx of cancer.  Maternal cousins: no hx of cancer.  Maternal grandfather: died of heart/respiratory disease.  He had a sister who had breast cancer, age unk Maternal grandmother:died over the age of 4 due to heart/respiratory disease. She had a brother with throat cancer and a brother with lung cancer.   Leah Moore is unaware of previous family history of genetic testing for hereditary cancer risks. Patient's maternal ancestors are of African American/[place] descent, and paternal ancestors are of African American descent. There is no reported Ashkenazi Jewish ancestry. There is no known consanguinity.  GENETIC COUNSELING ASSESSMENT: Leah Moore is a 53 y.o. female with a family history which is somewhat suggestive of a Hereditary Cancer Predisposition Syndrome. We, therefore, discussed and recommended the following at today's visit.   DISCUSSION: We reviewed the characteristics, features and inheritance patterns of hereditary cancer syndromes. We also discussed genetic testing, including the appropriate family members to test, the process  of testing, insurance coverage and turn-around-time for results. We discussed the implications of a negative, positive and/or variant of uncertain significant result. We offered Leah Moore the option pursue genetic testing for the Common Hereditary Cancers gene panel.   The Common Hereditary Cancer Panel offered by [facility] includes sequencing and/or deletion duplication testing of the following 53 genes: APC, ATM, AXIN2, BARD1, BMPR1A, BRCA1, BRCA2, BRIP1, BUB1B, CDH1, CDK4, CDKN2A, CHEK2, CTNNA1, DICER1, ENG, EPCAM, GALNT12, GREM1, HOXB13, KIT, MEN1, MLH1, MLH3, MSH2, MSH3, MSH6, MUTYH, NBN, NF1, NTHL1, PALB2, PDGFRA, PMS2, POLD1, POLE, PTEN, RAD50, RAD51C, RAD51D, RNF43, RPS20, SDHA, SDHB, SDHC, SDHD, SMAD4, SMARCA4, STK11, TP53, TSC1,  TSC2, VHL  We discussed that only 5-10% of cancers are associated with a Hereditary Cancer Predisposition Syndrome.  The most common hereditary cancer syndrome associated with colon cancer is Lynch Syndrome.  Lynch Syndrome is caused by mutations in the genes: MLH1, MSH2, MSH6, PMS2 and EPCAM.  This syndrome increases the risk for colon, uterine, ovarian and stomach cancers, as well as others.  Families with Lynch Syndrome tend to have multiple family members with these cancers, typically diagnosed under age 46, and diagnoses in multiple generations.    We discussed that there are several other genes that are associated with an increased risk for colon cancer and increased polyp burden (MUTYH, APC, POLE, CHEK2, etc.) We also dicussed that there are many genes that cause many different types of cancer risks.    We discussed that if she is found to have a mutation in one of these genes, it may impact future medical management recommendations such as increased cancer screenings and consideration of risk reducing surgeries.  A positive result could also have implications for the patient's family members.  A Negative result would mean we were unable to identify a hereditary  component to her cancer, but does not rule out the possibility of a hereditary basis for her cancer.  There could be mutations that are undetectable by current technology, or in genes not yet tested or identified to increase cancer risk.    We discussed the potential to find a Variant of Uncertain Significance or VUS.  These are variants that have not yet been identified as pathogenic or benign, and it is unknown if this variant is associated with increased cancer risk or if this is a normal finding.  Most VUS's are reclassified to benign or likely benign.   It should not be used to make medical management decisions. With time, we suspect the lab will determine the significance of any VUS's identified if any.   Based on Leah Moore's family history of cancer, she does not quite meet NCCN criteria for genetic testing. However, there is the self-option of $250 that is available to her should insurance not cover testing.    She reports blood was sent to a lab [facility] in January she has no results so does not know if this test was run or not.  We can see a TRF that was filled out 08/20/2018 for genetic testing, but see a correspondence it was put on hold and no results are available, so we do not know if her testing was completed or not.  We will hold off on ordering genetic testing for now until this can be sorted out so we do not duplicate testing.  If we need to send to a different lab, we can coordinate a blood draw in the future.    PLAN:   Leah Moore did not wish to pursue genetic testing at today's visit until we sort out what happened to her testing her [place] office initiated.  We remain available to coordinate genetic testing at any time in the future. We; therefore, recommend Leah Moore continue to follow the cancer screening guidelines given by her primary healthcare provider.  Lastly, we encouraged Leah Moore to remain in contact with cancer genetics annually so that we can continuously update the family  history and inform her of any changes in cancer genetics and testing that may be of benefit for this family.   Ms.  Moore questions were answered to her satisfaction today. Our contact information was provided should  additional questions or concerns arise. Thank you for the referral and allowing [place] to share in the care of your patient.   Tana Felts, MS, [facility] Genetic Counselor [email or web address] phone: [phone]  The patient was seen for a total of 35 minutes in face-to-face genetic counseling. Dr.'s [name], and Lindi Adie were available for questions regarding this case.

## 2018-09-25 ENCOUNTER — Telehealth: Payer: Self-pay | Admitting: Genetics

## 2018-09-25 NOTE — Telephone Encounter (Signed)
Followed up with patient regarding genetic testing.  I told her that I was able to get in contact with [facility] regarding her genetic testing- they told me it was performed and that they are going to follow-up with her regarding the result.  We do not need her to come in for another blood draw or follow-up here- [facility] should be in touch with her regarding these results.   Let my contact info if she has further questions.

## 2018-10-01 MED FILL — METOPROLOL SUCCINATE ER 50: 50 | 30 days supply | Qty: 30 | Fill #1

## 2018-10-01 MED FILL — SERTRALINE HCL 50 MG TABLET: 50 | 30 days supply | Qty: 30 | Fill #1

## 2018-10-01 MED FILL — FUROSEMIDE 20 MG TAB: 20 | 30 days supply | Qty: 30 | Fill #5

## 2018-10-01 MED FILL — AMLODIPINE BESYLATE 10 MG T: 10 | 30 days supply | Qty: 30 | Fill #1

## 2018-10-02 ENCOUNTER — Ambulatory Visit: Payer: 59 | Admitting: Pulmonary Disease

## 2018-10-02 ENCOUNTER — Encounter: Payer: Self-pay | Admitting: Pulmonary Disease

## 2018-10-02 VITALS — BP 126/78 | HR 68 | Ht 68.0 in | Wt 227.0 lb

## 2018-10-02 DIAGNOSIS — R0683 Snoring: Secondary | ICD-10-CM | POA: Diagnosis not present

## 2018-10-02 DIAGNOSIS — G478 Other sleep disorders: Secondary | ICD-10-CM

## 2018-10-02 NOTE — Addendum Note (Signed)
Addended by: Sylvester Harder on: 10/02/2018 12:06 PM   Modules accepted: Orders

## 2018-10-02 NOTE — Patient Instructions (Signed)
Moderate probability of significant OSA Insomnia  We will schedule you for home sleep study Treatment options as discussed Will give you call as soon as result of the sleep study is available Initiate management for insomnia as needed  I will see in the office in about 6 to 8 weeks   Sleep Apnea Sleep apnea is a condition in which breathing pauses or becomes shallow during sleep. Episodes of sleep apnea usually last 10 seconds or longer, and they may occur as many as 20 times an hour. Sleep apnea disrupts your sleep and keeps your body from getting the rest that it needs. This condition can increase your risk of certain health problems, including:  Heart attack.  Stroke.  Obesity.  Diabetes.  Heart failure.  Irregular heartbeat. There are three kinds of sleep apnea:  Obstructive sleep apnea. This kind is caused by a blocked or collapsed airway.  Central sleep apnea. This kind happens when the part of the brain that controls breathing does not send the correct signals to the muscles that control breathing.  Mixed sleep apnea. This is a combination of obstructive and central sleep apnea. What are the causes? The most common cause of this condition is a collapsed or blocked airway. An airway can collapse or become blocked if:  Your throat muscles are abnormally relaxed.  Your tongue and tonsils are larger than normal.  You are overweight.  Your airway is smaller than normal. What increases the risk? This condition is more likely to develop in people who:  Are overweight.  Smoke.  Have a smaller than normal airway.  Are elderly.  Are female.  Drink alcohol.  Take sedatives or tranquilizers.  Have a family history of sleep apnea. What are the signs or symptoms? Symptoms of this condition include:  Trouble staying asleep.  Daytime sleepiness and tiredness.  Irritability.  Loud snoring.  Morning headaches.  Trouble  concentrating.  Forgetfulness.  Decreased interest in sex.  Unexplained sleepiness.  Mood swings.  Personality changes.  Feelings of depression.  Waking up often during the night to urinate.  Dry mouth.  Sore throat. How is this diagnosed? This condition may be diagnosed with:  A medical history.  A physical exam.  A series of tests that are done while you are sleeping (sleep study). These tests are usually done in a sleep lab, but they may also be done at home. How is this treated? Treatment for this condition aims to restore normal breathing and to ease symptoms during sleep. It may involve managing health issues that can affect breathing, such as high blood pressure or obesity. Treatment may include:  Sleeping on your side.  Using a decongestant if you have nasal congestion.  Avoiding the use of depressants, including alcohol, sedatives, and narcotics.  Losing weight if you are overweight.  Making changes to your diet.  Quitting smoking.  Using a device to open your airway while you sleep, such as: ? An oral appliance. This is a custom-made mouthpiece that shifts your lower jaw forward. ? A continuous positive airway pressure (CPAP) device. This device delivers oxygen to your airway through a mask. ? A nasal expiratory positive airway pressure (EPAP) device. This device has valves that you put into each nostril. ? A bi-level positive airway pressure (BPAP) device. This device delivers oxygen to your airway through a mask.  Surgery if other treatments do not work. During surgery, excess tissue is removed to create a wider airway. It is important to get treatment  for sleep apnea. Without treatment, this condition can lead to:  High blood pressure.  Coronary artery disease.  (Men) An inability to achieve or maintain an erection (impotence).  Reduced thinking abilities. Follow these instructions at home:  Make any lifestyle changes that your health care  provider recommends.  Eat a healthy, well-balanced diet.  Take over-the-counter and prescription medicines only as told by your health care provider.  Avoid using depressants, including alcohol, sedatives, and narcotics.  Take steps to lose weight if you are overweight.  If you were given a device to open your airway while you sleep, use it only as told by your health care provider.  Do not use any tobacco products, such as cigarettes, chewing tobacco, and e-cigarettes. If you need help quitting, ask your health care provider.  Keep all follow-up visits as told by your health care provider. This is important. Contact a health care provider if:  The device that you received to open your airway during sleep is uncomfortable or does not seem to be working.  Your symptoms do not improve.  Your symptoms get worse. Get help right away if:  You develop chest pain.  You develop shortness of breath.  You develop discomfort in your back, arms, or stomach.  You have trouble speaking.  You have weakness on one side of your body.  You have drooping in your face. These symptoms may represent a serious problem that is an emergency. Do not wait to see if the symptoms will go away. Get medical help right away. Call your local emergency services (911 in the [place] Do not drive yourself to the hospital. This information is not intended to replace advice given to you by your health care provider. Make sure you discuss any questions you have with your health care provider. Document Released: 07/13/2002 Document Revised: 02/18/2017 Document Reviewed: 05/02/2015 Elsevier Interactive Patient Education  2019 [employer].

## 2018-10-02 NOTE — Progress Notes (Signed)
Leah Moore    [id number]    1966/01/08  Primary Care Physician:Sun, Charise Carwin, MD  Referring Physician: Kendrick Ranch, MD [address] [place], [place] [address]  Chief complaint:   Insomnia, nonrestorative sleep, snoring  HPI:  History of snoring No history of witnessed apneas Chronic insomnia over 10 years-may have started around when she was approaching menopause Multiple agents have not helped insomnia  Wakes up feeling like she is not a good night rest almost on a regular basis Usually goes to bed between 10 and 11 PM, over half an hour before she is able to sleep on most days, sometimes 2 to 3 hours Wake up time about 6:45 AM Wakes up about 3-4 times during the night No significant weight fluctuation  History of depression not well controlled at present Comorbidities including hypertension  Denies dryness of her mouth in the mornings She does have headaches in the mornings Mother has obstructive sleep apnea on CPAP treatment   Outpatient Encounter Medications as of 10/02/2018  Medication Sig  . amLODipine (NORVASC) 10 MG tablet amlodipine 10 mg tablet  Take 1 tablet every day by oral route.  . cetirizine (ZYRTEC) 10 MG tablet Take 10 mg by mouth at bedtime.   . cloNIDine (CATAPRES) 0.1 MG tablet Take 1 tablet (0.1 mg total) by mouth 2 (two) times daily.  . furosemide (LASIX) 20 MG tablet Take 1 tablet (20 mg total) by mouth every morning.  . latanoprost (XALATAN) 0.005 % ophthalmic solution Place 1 drop into both eyes at bedtime.   . metoprolol tartrate (LOPRESSOR) 25 MG tablet Take 0.5 tablets (12.5 mg total) by mouth at bedtime.  . potassium chloride SA (K-DUR,KLOR-CON) 20 MEQ tablet Take 1 tablet (20 mEq total) by mouth every morning.  . sertraline (ZOLOFT) 50 MG tablet sertraline 50 mg tablet  Take 1 tablet every day by oral route.  . Vitamin D, Ergocalciferol, (DRISDOL) 50000 UNITS CAPS capsule Take 50,000 Units by mouth every  Wednesday.  Marland Kitchen aspirin EC 81 MG tablet Take 81 mg by mouth every morning.   No facility-administered encounter medications on file as of 10/02/2018.     Allergies as of 10/02/2018 - Review Complete 10/02/2018  Allergen Reaction Noted  . Lisinopril Other (See Comments) 12/02/2012  . Oxycodone-acetaminophen Itching 10/02/2018    Past Medical History:  Diagnosis Date  . Arthritis   . Family history of breast cancer   . Family history of colon cancer   . Family history of colon cancer   . Family history of pancreatic cancer   . Family history of stomach cancer   . GERD (gastroesophageal reflux disease)   . Glaucoma   . H/O echocardiogram 2014  . Headache(784.0)    migraines  . Hypertension   . Mitral valve prolapse   . Vestibular vertigo     Past Surgical History:  Procedure Laterality Date  . CHOLECYSTECTOMY N/A 06/30/2013   Procedure: LAPAROSCOPIC CHOLECYSTECTOMY WITH INTRAOPERATIVE CHOLANGIOGRAM;  Surgeon: Clovis Pu. Cornett, MD;  Location: [facility] OR;  Service: General;  Laterality: N/A;  . KNEE ARTHROSCOPY Left    53 yrs old  . LAPAROSCOPIC APPENDECTOMY N/A 04/14/2014   Procedure: APPENDECTOMY LAPAROSCOPIC;  Surgeon: Romie Levee, MD;  Location: [facility];  Service: General;  Laterality: N/A;    Family History  Problem Relation Age of Onset  . Arthritis Mother   . Hypertension Mother   . Diabetes Mother   . Glaucoma  Mother   . COPD Mother   . Sleep apnea Mother   . Hypertension Father   . Stroke Father   . Hypertension Brother   . Glaucoma Brother   . Stroke Maternal Grandmother   . Heart disease Maternal Grandmother   . COPD Maternal Grandfather   . Heart disease Maternal Grandfather   . Hypertension Paternal Grandmother   . Arthritis Paternal Grandmother   . Pancreatic cancer Paternal Grandmother   . Heart disease Paternal Grandfather   . Colon cancer Paternal Uncle   . Kidney cancer Paternal Uncle   . Prostate cancer Paternal Uncle   . Stomach cancer Other   .  Prostate cancer Other   . Lung cancer Other     Social History   Socioeconomic History  . Marital status: Single    Spouse name: Not on file  . Number of children: Not on file  . Years of education: Not on file  . Highest education level: Not on file  Occupational History  . Not on file  Social Needs  . Financial resource strain: Not on file  . Food insecurity:    Worry: Not on file    Inability: Not on file  . Transportation needs:    Medical: Not on file    Non-medical: Not on file  Tobacco Use  . Smoking status: Never Smoker  . Smokeless tobacco: Never Used  . Tobacco comment: social alcohol  Substance and Sexual Activity  . Alcohol use: Yes    Comment: socially  . Drug use: No  . Sexual activity: Yes    Birth control/protection: Condom  Lifestyle  . Physical activity:    Days per week: Not on file    Minutes per session: Not on file  . Stress: Not on file  Relationships  . Social connections:    Talks on phone: Not on file    Gets together: Not on file    Attends religious service: Not on file    Active member of club or organization: Not on file    Attends meetings of clubs or organizations: Not on file    Relationship status: Not on file  . Intimate partner violence:    Fear of current or ex partner: Not on file    Emotionally abused: Not on file    Physically abused: Not on file    Forced sexual activity: Not on file  Other Topics Concern  . Not on file  Social History Narrative  . Not on file    Review of Systems  HENT: Negative.   Respiratory: Negative for cough and shortness of breath.   Cardiovascular: Negative.   Psychiatric/Behavioral: Positive for sleep disturbance.  All other systems reviewed and are negative.   Vitals:   10/02/18 1130  BP: 126/78  Pulse: 68  SpO2: 96%     Physical Exam  Constitutional: She appears well-developed and well-nourished.  HENT:  Head: Normocephalic.  Mallampati 3, crowded oropharynx  Eyes: Pupils  are equal, round, and reactive to light. Conjunctivae and EOM are normal.  Neck: Normal range of motion. Neck supple. No tracheal deviation present. No thyromegaly present.  Cardiovascular: Normal rate and regular rhythm.  Pulmonary/Chest: Effort normal and breath sounds normal. No respiratory distress. She has no wheezes.   Results of the Epworth flowsheet 10/02/2018  Sitting and reading 2  Watching TV 2  Sitting, inactive in a public place (e.g. a theatre or a meeting) 1  As a passenger in a car  for an hour without a break 1  Lying down to rest in the afternoon when circumstances permit 3  Sitting and talking to someone 0  Sitting quietly after a lunch without alcohol 0  In a car, while stopped for a few minutes in traffic 0  Total score 9   Assessment:  Moderate probability of significant sleep disordered breathing -History of snoring, no witnessed apneas, daytime sleepiness  Insomnia  Depression-well-controlled  Plan/Recommendations: We will obtain a home sleep study  Pathophysiology of sleep disordered breathing discussed with the patient Treatment options discussed with the patient Management options for insomnia discussed with the patient Patient should between insomnia and sleep disordered breathing discussed  I will see her back in the office in about 6 to 8 weeks following study  Encouraged to call with any significant concerns   Virl Diamond MD [facility] 10/02/2018, 11:55 AM  CC: Kendrick Ranch, *

## 2018-10-21 MED FILL — LATANOPROST 0.005% EYE DRP: 0.005 | 25 days supply | Qty: 3 | Fill #3

## 2018-11-12 MED FILL — CLONIDINE HCL 0.1 MG TABS: 0.1 | 30 days supply | Qty: 60 | Fill #0

## 2018-11-12 MED FILL — FUROSEMIDE 20 MG TAB: 20 | 30 days supply | Qty: 30 | Fill #0

## 2018-11-12 MED FILL — AMLODIPINE BESYLATE 10 MG T: 10 | 30 days supply | Qty: 30 | Fill #2

## 2018-11-12 MED FILL — SERTRALINE HCL 50 MG TABLET: 50 | 30 days supply | Qty: 30 | Fill #2

## 2018-11-12 MED FILL — METOPROLOL SUCCINATE ER 50: 50 | 30 days supply | Qty: 30 | Fill #2

## 2018-12-04 ENCOUNTER — Other Ambulatory Visit: Payer: Self-pay

## 2018-12-04 ENCOUNTER — Ambulatory Visit: Payer: 59

## 2018-12-04 DIAGNOSIS — G4733 Obstructive sleep apnea (adult) (pediatric): Secondary | ICD-10-CM

## 2018-12-04 DIAGNOSIS — G478 Other sleep disorders: Secondary | ICD-10-CM

## 2018-12-04 DIAGNOSIS — R0683 Snoring: Secondary | ICD-10-CM

## 2018-12-05 DIAGNOSIS — G4733 Obstructive sleep apnea (adult) (pediatric): Secondary | ICD-10-CM | POA: Diagnosis not present

## 2019-01-02 MED FILL — METOPROLOL SUCCINATE ER 50: 50 | 30 days supply | Qty: 30 | Fill #0

## 2019-01-02 MED FILL — SERTRALINE HCL 50 MG TABS: 50 | 30 days supply | Qty: 30 | Fill #3

## 2019-01-02 MED FILL — AMLODIPINE BESYLATE 10 MG T: 10 | 30 days supply | Qty: 30 | Fill #0

## 2019-01-02 MED FILL — LATANOPROST 0.005% EYE DRP: 0.005 | 25 days supply | Qty: 3 | Fill #4

## 2019-01-02 MED FILL — FUROSEMIDE 20 MG TAB: 20 | 30 days supply | Qty: 30 | Fill #1

## 2019-02-02 MED FILL — CLONIDINE HCL 0.1 MG TABS: 0.1 | 30 days supply | Qty: 60 | Fill #0

## 2019-02-02 MED FILL — METOPROLOL SUCCINATE ER 50: 50 | 30 days supply | Qty: 30 | Fill #0

## 2019-02-02 MED FILL — FUROSEMIDE 20 MG TAB: 20 | 30 days supply | Qty: 30 | Fill #0

## 2019-02-02 MED FILL — SERTRALINE HCL 50 MG TABS: 50 | 30 days supply | Qty: 30 | Fill #0

## 2019-02-02 MED FILL — AMLODIPINE BESYLATE 10 MG T: 10 | 30 days supply | Qty: 30 | Fill #0

## 2019-02-03 MED FILL — COMBIGAN EYE DROPS: 0.2-0.5 | 25 days supply | Qty: 5 | Fill #0

## 2019-02-03 MED FILL — LATANOPROST 0.005% EYE DRP: 0.005 | 25 days supply | Qty: 3 | Fill #0

## 2019-03-26 MED FILL — SERTRALINE HCL 50 MG TABS: 50 | 30 days supply | Qty: 30 | Fill #1

## 2019-03-26 MED FILL — METOPROLOL SUCCINATE ER 50: 50 | 30 days supply | Qty: 30 | Fill #1

## 2019-03-26 MED FILL — AMLODIPINE BESYLATE 10 MG T: 10 | 30 days supply | Qty: 30 | Fill #1

## 2019-03-26 MED FILL — FUROSEMIDE 20 MG TAB: 20 | 30 days supply | Qty: 30 | Fill #1

## 2019-03-26 MED FILL — cloNIDine HCL 0.1 MG TABS: 0.1 | 30 days supply | Qty: 60 | Fill #1

## 2019-09-17 ENCOUNTER — Other Ambulatory Visit: Payer: Self-pay | Admitting: Ophthalmology

## 2019-09-17 DIAGNOSIS — H469 Unspecified optic neuritis: Secondary | ICD-10-CM

## 2019-10-05 IMAGING — MR MR HEAD WO/W CM
12 series · 48 of 48 positions shown · IV contrast (multihance)
Comparison: MRI head 03/22/2011

CLINICAL DATA: Unspecified optic neuritis

Creatinine was obtained on site at [HOSPITAL] at [HOSPITAL].
Results: Creatinine 0.8 mg/dL.
EXAM:
MRI HEAD WITHOUT AND WITH CONTRAST
TECHNIQUE: Multiplanar, multiecho pulse sequences of the brain and surrounding
structures were obtained without and with intravenous contrast.
CONTRAST:  20mL MULTIHANCE GADOBENATE DIMEGLUMINE 529 MG/ML IV SOLN

[Series 3: ep2d_diff_(id)_trace · axial · 3.0mm · 1.88mm/px · z∈[-67,+86]mm · 6 of 98 slices shown]
[im 1/98]
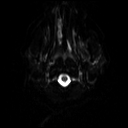
[im 20/98]
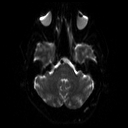
[im 39/98]
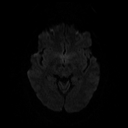
[im 59/98]
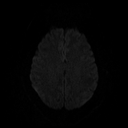
[im 78/98]
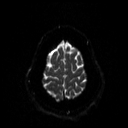
[im 98/98]
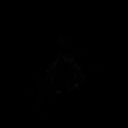

[Series 4: ep2d_diff_(id)_trace_adc · axial · 3.0mm · 1.88mm/px · z∈[-67,+86]mm · 3 of 52 slices shown]
[im 1/52]
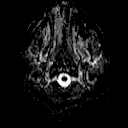
[im 26/52]
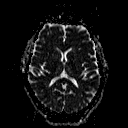
[im 52/52]
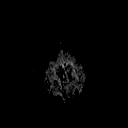

[Series 5: ep2d_diff_cor · coronal · 5.0mm · 1.77mm/px · 4 of 55 slices shown]
[im 1/55]
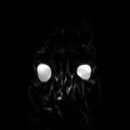
[im 19/55]
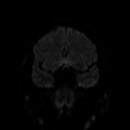
[im 37/55]
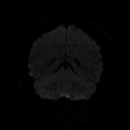
[im 55/55]
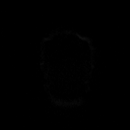

[Series 6: ep2d_diff_cor_adc · coronal · 5.0mm · 1.77mm/px · 2 of 28 slices shown]
[im 1/28]
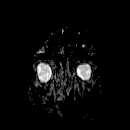
[im 28/28]
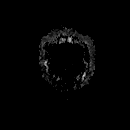

[Series 7: FLAIR · axial · 3.0mm · 0.47mm/px · z∈[-70,+92]mm · 2 of 28 slices shown]
[im 1/28]
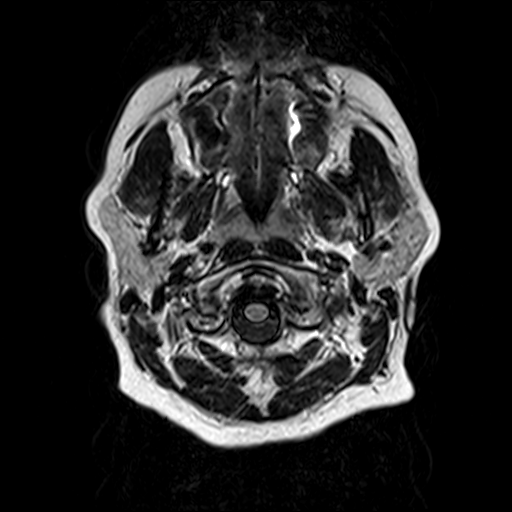
[im 28/28]
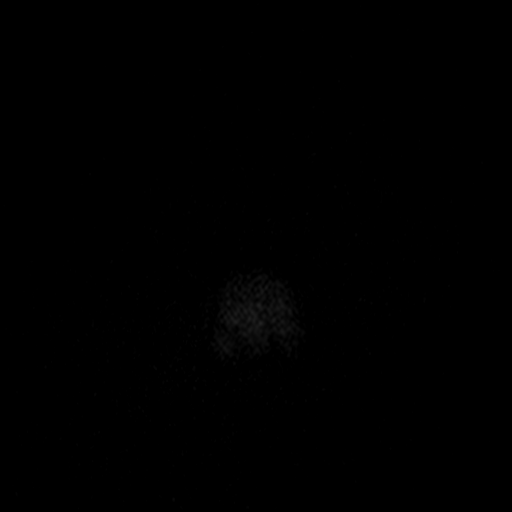

[Series 8: t2_tse_tra · axial · 5.0mm · 0.62mm/px · z∈[-70,+92]mm · 2 of 28 slices shown]
[im 1/28]
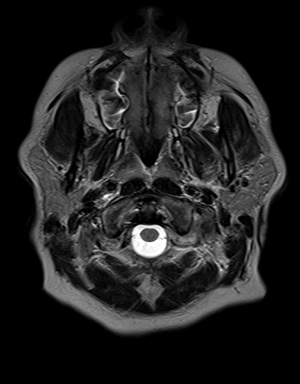
[im 28/28]
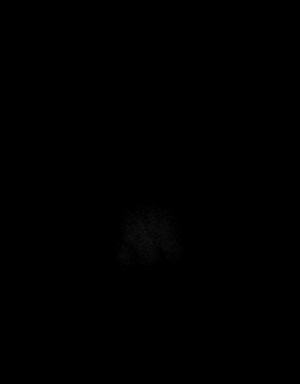

[Series 10: swi_images · axial · 2.0mm · 0.94mm/px · z∈[-76,+98]mm · 6 of 88 slices shown]
[im 1/88]
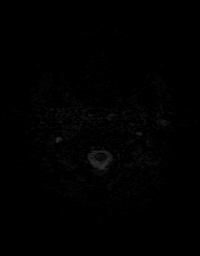
[im 18/88]
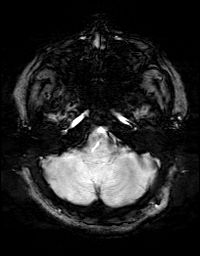
[im 35/88]
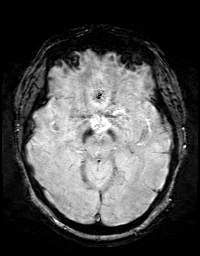
[im 53/88]
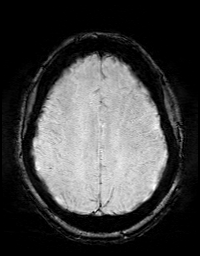
[im 70/88]
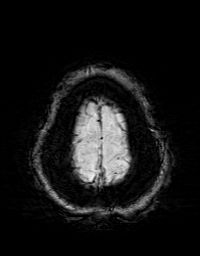
[im 88/88]
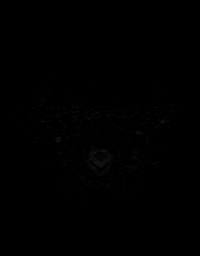

[Series 11: t1_mpr_tra · axial · 1.2mm · 0.75mm/px · z∈[-72,+94]mm · 9 of 144 slices shown]
[im 1/144]
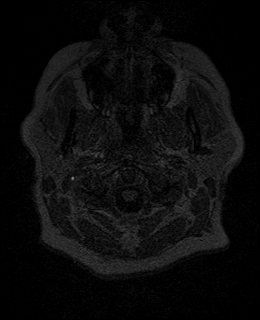
[im 18/144]
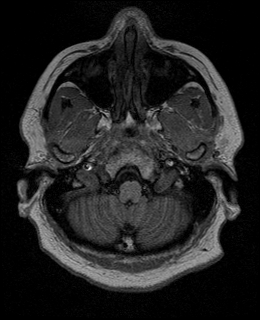
[im 36/144]
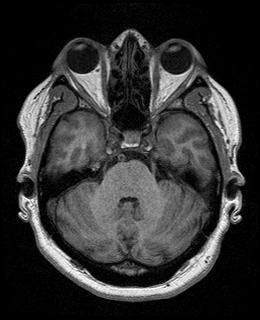
[im 54/144]
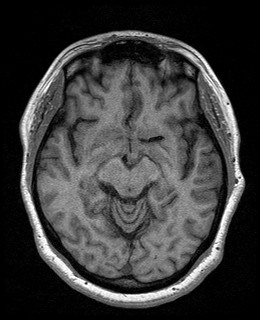
[im 72/144]
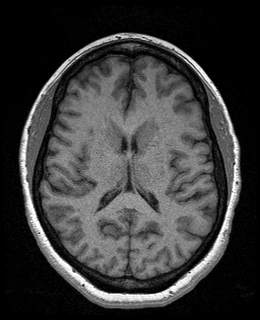
[im 90/144]
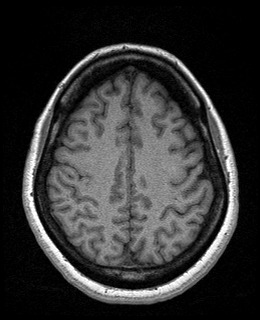
[im 108/144]
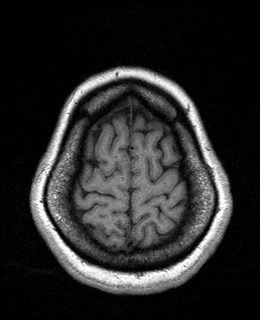
[im 126/144]
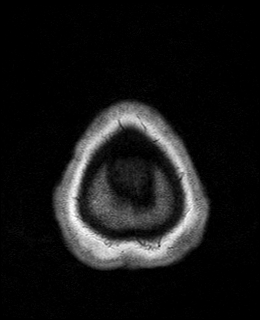
[im 144/144]
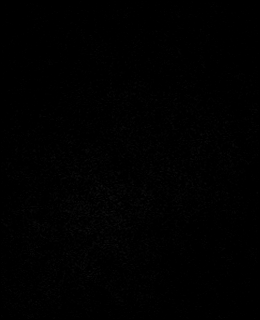

[Series 12: t1_se_sag-repeat · sagittal · 5.0mm · 0.47mm/px · 1 of 23 slices shown]
[im 1/23]
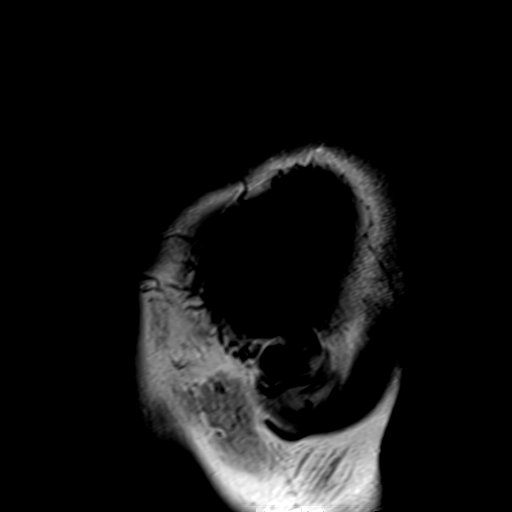

[Series 13: T2 post-contrast · coronal · 5.0mm · 0.45mm/px · 2 of 32 slices shown]
[im 1/32]
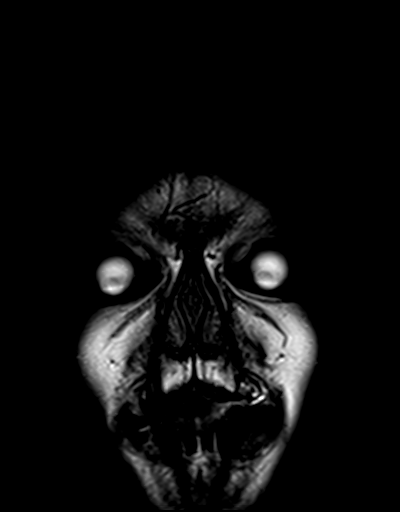
[im 32/32]
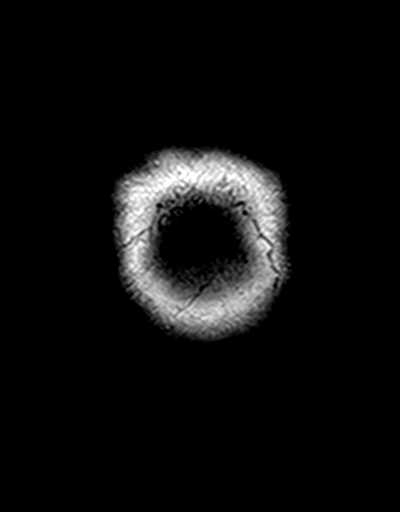

[Series 14: post t1_mpr_tra · axial · 1.2mm · 0.75mm/px · z∈[-73,+92]mm · 9 of 144 slices shown]
[im 1/144]
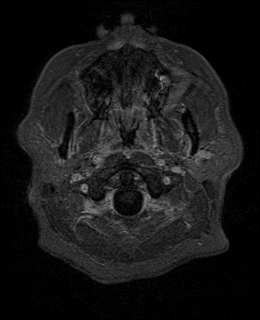
[im 18/144]
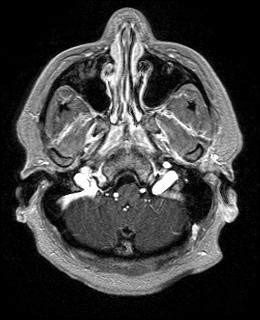
[im 36/144]
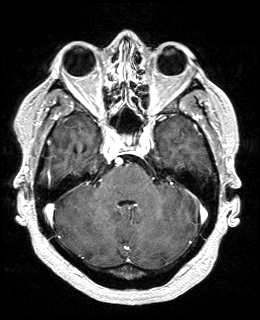
[im 54/144]
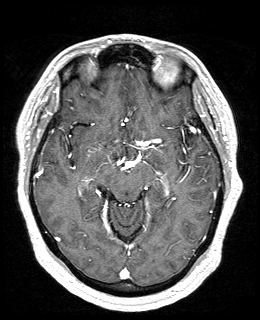
[im 72/144]
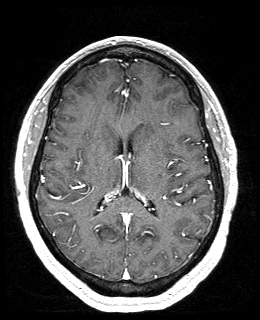
[im 90/144]
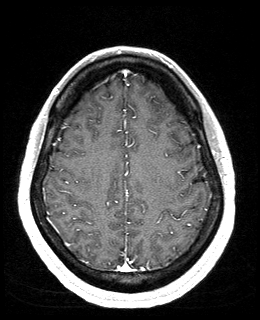
[im 108/144]
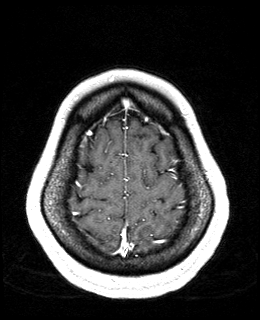
[im 126/144]
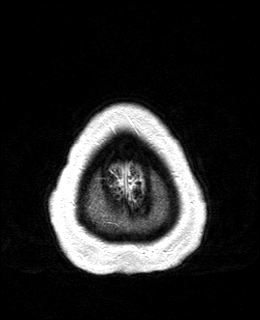
[im 144/144]
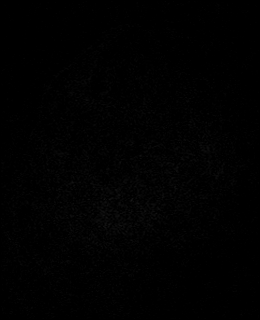

[Series 15: T1 post-contrast · coronal · 5.0mm · 0.72mm/px · 2 of 32 slices shown]
[im 1/32]
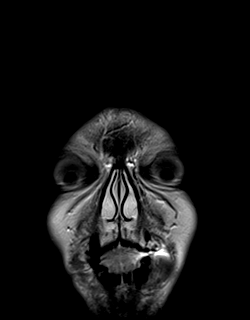
[im 32/32]
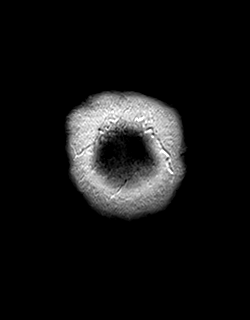

[48 of 48 positions shown; findings below may reference images not displayed]

FINDINGS: Brain: Routine imaging of the brain was performed without with
contrast. Dedicated imaging of the orbit was not performed.. MRI of
the orbit is scheduled for 07/29/2018.

Ventricle size and cerebral volume normal. Negative for acute
infarct. Scattered small deep white matter hyperintensities have
developed since 5865. Brainstem and cerebellum normal. Negative for
acute infarct. Negative for hemorrhage or mass.

Normal enhancement postcontrast administration.

Vascular: Normal arterial enhancement

Skull and upper cervical spine: Negative

Sinuses/Orbits: Mucosal edema paranasal sinuses. No orbital mass
lesion.

Other: None
IMPRESSION: No acute abnormality. Scattered small white matter hyperintensities
have developed since 5865. Nonspecific appearance. Differential
diagnosis includes chronic microvascular ischemia, vasculitis,
complex migraine headache, and demyelinating disease. Distribution
of white matter lesions is not strongly suggestive of demyelinating
disease.

## 2020-07-07 ENCOUNTER — Other Ambulatory Visit (HOSPITAL_BASED_OUTPATIENT_CLINIC_OR_DEPARTMENT_OTHER): Payer: Self-pay | Admitting: Gastroenterology

## 2020-07-07 MED FILL — SM GENTLE LAXATIVE EC 5 MG: 5 MG | 30 days supply | Qty: 100 | Fill #0

## 2020-07-07 MED FILL — PEG-3350 SOLUTION: 420 | 2 days supply | Qty: 4000 | Fill #0

## 2020-09-10 DIAGNOSIS — Z03818 Encounter for observation for suspected exposure to other biological agents ruled out: Secondary | ICD-10-CM | POA: Diagnosis not present

## 2020-10-10 DIAGNOSIS — H00013 Hordeolum externum right eye, unspecified eyelid: Secondary | ICD-10-CM | POA: Diagnosis not present

## 2020-12-11 DIAGNOSIS — Z03818 Encounter for observation for suspected exposure to other biological agents ruled out: Secondary | ICD-10-CM | POA: Diagnosis not present

## 2021-04-24 DIAGNOSIS — Z1231 Encounter for screening mammogram for malignant neoplasm of breast: Secondary | ICD-10-CM | POA: Diagnosis not present

## 2021-04-24 DIAGNOSIS — N760 Acute vaginitis: Secondary | ICD-10-CM | POA: Diagnosis not present

## 2021-04-24 DIAGNOSIS — Z6833 Body mass index (BMI) 33.0-33.9, adult: Secondary | ICD-10-CM | POA: Diagnosis not present

## 2021-04-24 DIAGNOSIS — Z01419 Encounter for gynecological examination (general) (routine) without abnormal findings: Secondary | ICD-10-CM | POA: Diagnosis not present

## 2021-08-23 DIAGNOSIS — F4321 Adjustment disorder with depressed mood: Secondary | ICD-10-CM | POA: Diagnosis not present

## 2021-08-23 DIAGNOSIS — I10 Essential (primary) hypertension: Secondary | ICD-10-CM | POA: Diagnosis not present

## 2021-08-23 DIAGNOSIS — N951 Menopausal and female climacteric states: Secondary | ICD-10-CM | POA: Diagnosis not present

## 2021-09-25 DIAGNOSIS — F4321 Adjustment disorder with depressed mood: Secondary | ICD-10-CM | POA: Diagnosis not present

## 2021-09-25 DIAGNOSIS — N951 Menopausal and female climacteric states: Secondary | ICD-10-CM | POA: Diagnosis not present

## 2022-02-19 DIAGNOSIS — I10 Essential (primary) hypertension: Secondary | ICD-10-CM | POA: Diagnosis not present

## 2022-02-19 DIAGNOSIS — N951 Menopausal and female climacteric states: Secondary | ICD-10-CM | POA: Diagnosis not present

## 2022-04-30 DIAGNOSIS — H2513 Age-related nuclear cataract, bilateral: Secondary | ICD-10-CM | POA: Diagnosis not present

## 2022-04-30 DIAGNOSIS — H401113 Primary open-angle glaucoma, right eye, severe stage: Secondary | ICD-10-CM | POA: Diagnosis not present

## 2022-04-30 DIAGNOSIS — H401122 Primary open-angle glaucoma, left eye, moderate stage: Secondary | ICD-10-CM | POA: Diagnosis not present

## 2022-06-01 DIAGNOSIS — Z01419 Encounter for gynecological examination (general) (routine) without abnormal findings: Secondary | ICD-10-CM | POA: Diagnosis not present

## 2022-06-01 DIAGNOSIS — Z1151 Encounter for screening for human papillomavirus (HPV): Secondary | ICD-10-CM | POA: Diagnosis not present

## 2022-06-01 DIAGNOSIS — Z1231 Encounter for screening mammogram for malignant neoplasm of breast: Secondary | ICD-10-CM | POA: Diagnosis not present

## 2022-06-01 DIAGNOSIS — Z124 Encounter for screening for malignant neoplasm of cervix: Secondary | ICD-10-CM | POA: Diagnosis not present

## 2022-06-01 DIAGNOSIS — Z6832 Body mass index (BMI) 32.0-32.9, adult: Secondary | ICD-10-CM | POA: Diagnosis not present

## 2022-06-18 DIAGNOSIS — H401113 Primary open-angle glaucoma, right eye, severe stage: Secondary | ICD-10-CM | POA: Diagnosis not present

## 2022-06-18 DIAGNOSIS — H2513 Age-related nuclear cataract, bilateral: Secondary | ICD-10-CM | POA: Diagnosis not present

## 2022-06-18 DIAGNOSIS — H04123 Dry eye syndrome of bilateral lacrimal glands: Secondary | ICD-10-CM | POA: Diagnosis not present

## 2022-06-18 DIAGNOSIS — H401122 Primary open-angle glaucoma, left eye, moderate stage: Secondary | ICD-10-CM | POA: Diagnosis not present

## 2022-08-27 DIAGNOSIS — E669 Obesity, unspecified: Secondary | ICD-10-CM | POA: Diagnosis not present

## 2022-08-27 DIAGNOSIS — N951 Menopausal and female climacteric states: Secondary | ICD-10-CM | POA: Diagnosis not present

## 2022-08-27 DIAGNOSIS — I10 Essential (primary) hypertension: Secondary | ICD-10-CM | POA: Diagnosis not present

## 2022-10-03 DIAGNOSIS — I10 Essential (primary) hypertension: Secondary | ICD-10-CM | POA: Diagnosis not present

## 2022-10-03 DIAGNOSIS — J019 Acute sinusitis, unspecified: Secondary | ICD-10-CM | POA: Diagnosis not present

## 2022-10-15 DIAGNOSIS — H04123 Dry eye syndrome of bilateral lacrimal glands: Secondary | ICD-10-CM | POA: Diagnosis not present

## 2022-10-15 DIAGNOSIS — H401122 Primary open-angle glaucoma, left eye, moderate stage: Secondary | ICD-10-CM | POA: Diagnosis not present

## 2022-10-15 DIAGNOSIS — H2513 Age-related nuclear cataract, bilateral: Secondary | ICD-10-CM | POA: Diagnosis not present

## 2022-10-15 DIAGNOSIS — H401113 Primary open-angle glaucoma, right eye, severe stage: Secondary | ICD-10-CM | POA: Diagnosis not present

## 2023-02-25 DIAGNOSIS — R7303 Prediabetes: Secondary | ICD-10-CM | POA: Diagnosis not present

## 2023-02-25 DIAGNOSIS — N951 Menopausal and female climacteric states: Secondary | ICD-10-CM | POA: Diagnosis not present

## 2023-02-25 DIAGNOSIS — I10 Essential (primary) hypertension: Secondary | ICD-10-CM | POA: Diagnosis not present

## 2023-02-25 DIAGNOSIS — M25562 Pain in left knee: Secondary | ICD-10-CM | POA: Diagnosis not present

## 2023-02-25 DIAGNOSIS — E669 Obesity, unspecified: Secondary | ICD-10-CM | POA: Diagnosis not present

## 2023-03-11 DIAGNOSIS — H2513 Age-related nuclear cataract, bilateral: Secondary | ICD-10-CM | POA: Diagnosis not present

## 2023-03-11 DIAGNOSIS — H04123 Dry eye syndrome of bilateral lacrimal glands: Secondary | ICD-10-CM | POA: Diagnosis not present

## 2023-03-11 DIAGNOSIS — H401122 Primary open-angle glaucoma, left eye, moderate stage: Secondary | ICD-10-CM | POA: Diagnosis not present

## 2023-03-11 DIAGNOSIS — H401113 Primary open-angle glaucoma, right eye, severe stage: Secondary | ICD-10-CM | POA: Diagnosis not present

## 2023-08-19 DIAGNOSIS — H04123 Dry eye syndrome of bilateral lacrimal glands: Secondary | ICD-10-CM | POA: Diagnosis not present

## 2023-08-19 DIAGNOSIS — H401113 Primary open-angle glaucoma, right eye, severe stage: Secondary | ICD-10-CM | POA: Diagnosis not present

## 2023-08-19 DIAGNOSIS — H401122 Primary open-angle glaucoma, left eye, moderate stage: Secondary | ICD-10-CM | POA: Diagnosis not present

## 2023-08-19 DIAGNOSIS — H2513 Age-related nuclear cataract, bilateral: Secondary | ICD-10-CM | POA: Diagnosis not present

## 2023-08-30 DIAGNOSIS — I10 Essential (primary) hypertension: Secondary | ICD-10-CM | POA: Diagnosis not present

## 2023-08-30 DIAGNOSIS — F329 Major depressive disorder, single episode, unspecified: Secondary | ICD-10-CM | POA: Diagnosis not present

## 2023-08-30 DIAGNOSIS — R202 Paresthesia of skin: Secondary | ICD-10-CM | POA: Diagnosis not present

## 2023-08-30 DIAGNOSIS — N951 Menopausal and female climacteric states: Secondary | ICD-10-CM | POA: Diagnosis not present

## 2024-02-17 NOTE — Progress Notes (Deleted)
   Cardiology Office Note    Date:  02/17/2024  ID:  ANALYSSA DOWNS, DOB 1965-10-14, MRN [id number] PCP:  Sun, Vyvyan, MD  Cardiologist:  None - New  Chief Complaint: ***  History of Present Illness: .    PEGGE CUMBERLEDGE is a 58 y.o. female with visit-pertinent history of remote mitral valve prolapse, longstanding HTN, family history of HTN/MI/thoracic aneurysm, obesity, low vitamin D  levels, glaucoma, depression/anxiety, migraines, vertigo, angioedema with ACEI seen for evaluation of chest pain at the request of Dr. Austin.  She carries a remote history of mitral valve prolapse, though last echo 2014 did not reveal such - EF 60-65%, G1DD otherwise. Nuc in 2014 showed hypertensive response to exercise but otherwise normal/low risk.  2d echo to start for thoracic aneurysm hx Needs lab  Chest pain of uncertain etiology Essential HTN History of mitral valve prolapse Family history of aneurysm  Family History: Tobacco: Alcohol: Drug use:  Labwork independently reviewed: C.E. 08/2023 Cr 0.78, K 4.3, LDL 95, trig 73 02/2023 A1C 4.9 2015 CBC wnl  ROS: .    Please see the history of present illness. Otherwise, review of systems is positive for ***.  All other systems are reviewed and otherwise negative.  Studies Reviewed: SABRA    EKG:  EKG is ordered today, personally reviewed, demonstrating ***  CV Studies: Cardiac studies reviewed are outlined and summarized above. Otherwise please see EMR for full report.   Current Reported Medications:.    No outpatient medications have been marked as taking for the 02/18/24 encounter (Appointment) with Mesha Schamberger N, PA-C.    Physical Exam:    VS:  There were no vitals taken for this visit.   Wt Readings from Last 3 Encounters:  10/02/18 227 lb (103 kg)  09/29/14 218 lb (98.9 kg)  05/12/14 209 lb (94.8 kg)    GEN: Well nourished, well developed in no acute distress NECK: No JVD; No carotid bruits CARDIAC: ***RRR, no murmurs, rubs,  gallops RESPIRATORY:  Clear to auscultation without rales, wheezing or rhonchi  ABDOMEN: Soft, non-tender, non-distended EXTREMITIES:  No edema; No acute deformity   Asessement and Plan:.     ***     Disposition: F/u with ***  ***[id number]***  Signed, Alfhild Partch N Carrick Rijos, PA-C

## 2024-02-18 ENCOUNTER — Ambulatory Visit: Admitting: Physician Assistant

## 2024-02-18 DIAGNOSIS — I341 Nonrheumatic mitral (valve) prolapse: Secondary | ICD-10-CM

## 2024-02-18 DIAGNOSIS — R079 Chest pain, unspecified: Secondary | ICD-10-CM

## 2024-02-18 DIAGNOSIS — Z8249 Family history of ischemic heart disease and other diseases of the circulatory system: Secondary | ICD-10-CM

## 2024-02-18 DIAGNOSIS — I10 Essential (primary) hypertension: Secondary | ICD-10-CM

## 2024-02-18 NOTE — Progress Notes (Unsigned)
 Cardiology Office Note:    Date:  02/18/2024   ID:  Leah Moore, DOB February 17, 1966, MRN [id number]  PCP:  Sun, Vyvyan, MD   [facility] Providers Cardiologist:  None { Click to update primary MD,subspecialty MD or APP then REFRESH:1}    Referring MD: Sun, Vyvyan, MD   No chief complaint on file. ***  History of Present Illness:    Leah Moore is a 58 y.o. female with a hx of remote mitral valve prolapse, longstanding HTN, family history of HTN/MI/thoracic aneurysm, obesity, low vitamin D  levels, glaucoma, depression/anxiety, migraines, vertigo, angioedema with ACEI seen for evaluation of chest pain at the request of Dr. Austin.  She carries a remote history of mitral valve prolapse, though last echo 2014 did not reveal such - EF 60-65%, G1DD otherwise. Nuc in 2014 showed hypertensive response to exercise but otherwise normal/low risk.       Chest pain of uncertain etiology   Essential HTN   History of mitral valve prolapse - repeat echo   Family history of aneurysm - 2d echo to start for thoracic aneurysm hx   Needs lab   Family History: Tobacco: Alcohol: Drug use:  Labwork independently reviewed: C.E. 08/2023 Cr 0.78, K 4.3, LDL 95, trig 73 02/2023 A1C 4.9 2015 CBC wnl       Past Medical History:  Diagnosis Date   Arthritis    Family history of breast cancer    Family history of colon cancer    Family history of colon cancer    Family history of pancreatic cancer    Family history of stomach cancer    GERD (gastroesophageal reflux disease)    Glaucoma    H/O echocardiogram 2014   Headache(784.0)    migraines   Hypertension    Mitral valve prolapse    Vestibular vertigo     Past Surgical History:  Procedure Laterality Date   CHOLECYSTECTOMY N/A 06/30/2013   Procedure: LAPAROSCOPIC CHOLECYSTECTOMY WITH INTRAOPERATIVE CHOLANGIOGRAM;  Surgeon: Debby A. Cornett, MD;  Location: [facility] OR;  Service: General;  Laterality: N/A;   KNEE  ARTHROSCOPY Left    58 yrs old   LAPAROSCOPIC APPENDECTOMY N/A 04/14/2014   Procedure: APPENDECTOMY LAPAROSCOPIC;  Surgeon: Bernarda Debby, MD;  Location: [facility];  Service: General;  Laterality: N/A;    Current Medications: No outpatient medications have been marked as taking for the 02/19/24 encounter (Appointment) with Madie Jon Garre, PA.     Allergies:   Lisinopril and Oxycodone -acetaminophen    Social History   Socioeconomic History   Marital status: Single    Spouse name: Not on file   Number of children: Not on file   Years of education: Not on file   Highest education level: Not on file  Occupational History   Not on file  Tobacco Use   Smoking status: Never   Smokeless tobacco: Never   Tobacco comments:    social alcohol  Substance and Sexual Activity   Alcohol use: Yes    Comment: socially   Drug use: No   Sexual activity: Yes    Birth control/protection: Condom  Other Topics Concern   Not on file  Social History Narrative   Not on file   Social Drivers of Health   Financial Resource Strain: Not on file  Food Insecurity: Not on file  Transportation Needs: Not on file  Physical Activity: Not on file  Stress: Not on file  Social Connections: Not on file     Family  History: The patient's ***family history includes Arthritis in her mother and paternal grandmother; COPD in her maternal grandfather and mother; Colon cancer in her paternal uncle; Diabetes in her mother; Glaucoma in her brother and mother; Heart disease in her maternal grandfather, maternal grandmother, and paternal grandfather; Hypertension in her brother, father, mother, and paternal grandmother; Kidney cancer in her paternal uncle; Lung cancer in an other family member; Pancreatic cancer in her paternal grandmother; Prostate cancer in her paternal uncle and another family member; Sleep apnea in her mother; Stomach cancer in an other family member; Stroke in her father and maternal  grandmother.  ROS:   Please see the history of present illness.    *** All other systems reviewed and are negative.  EKGs/Labs/Other Studies Reviewed:    The following studies were reviewed today: ***      Recent Labs: No results found for requested labs within last 365 days.  Recent Lipid Panel    Component Value Date/Time   CHOL 151 05/12/2014 1454   TRIG 146 05/12/2014 1454   HDL 53 05/12/2014 1454   CHOLHDL 2.8 05/12/2014 1454   VLDL 29 05/12/2014 1454   LDLCALC 69 05/12/2014 1454     Risk Assessment/Calculations:   {Does this patient have ATRIAL FIBRILLATION?:[id number]}  No BP recorded.  {Refresh Note OR Click here to enter BP  :1}***         Physical Exam:    VS:  There were no vitals taken for this visit.    Wt Readings from Last 3 Encounters:  10/02/18 227 lb (103 kg)  09/29/14 218 lb (98.9 kg)  05/12/14 209 lb (94.8 kg)     GEN: *** Well nourished, well developed in no acute distress HEENT: Normal NECK: No JVD; No carotid bruits LYMPHATICS: No lymphadenopathy CARDIAC: ***RRR, no murmurs, rubs, gallops RESPIRATORY:  Clear to auscultation without rales, wheezing or rhonchi  ABDOMEN: Soft, non-tender, non-distended MUSCULOSKELETAL:  No edema; No deformity  SKIN: Warm and dry NEUROLOGIC:  Alert and oriented x 3 PSYCHIATRIC:  Normal affect   ASSESSMENT:    No diagnosis found. PLAN:    In order of problems listed above:  ***      {Are you ordering a CV Procedure (e.g. stress test, cath, DCCV, TEE, etc)?   Press F2        :[id number]}    Medication Adjustments/Labs and Tests Ordered: Current medicines are reviewed at length with the patient today.  Concerns regarding medicines are outlined above.  No orders of the defined types were placed in this encounter.  No orders of the defined types were placed in this encounter.   There are no Patient Instructions on file for this visit.   Signed, Jon Nat Hails, [place]  02/18/2024 2:36 PM     [facility]

## 2024-02-19 ENCOUNTER — Ambulatory Visit: Attending: Physician Assistant | Admitting: Physician Assistant

## 2024-02-19 ENCOUNTER — Other Ambulatory Visit: Payer: Self-pay | Admitting: Physician Assistant

## 2024-02-19 ENCOUNTER — Encounter: Payer: Self-pay | Admitting: Physician Assistant

## 2024-02-19 VITALS — BP 126/96 | HR 78 | Ht 68.0 in | Wt 229.4 lb

## 2024-02-19 DIAGNOSIS — R0602 Shortness of breath: Secondary | ICD-10-CM | POA: Diagnosis not present

## 2024-02-19 DIAGNOSIS — R079 Chest pain, unspecified: Secondary | ICD-10-CM | POA: Diagnosis not present

## 2024-02-19 DIAGNOSIS — I10 Essential (primary) hypertension: Secondary | ICD-10-CM

## 2024-02-19 DIAGNOSIS — R0600 Dyspnea, unspecified: Secondary | ICD-10-CM

## 2024-02-19 DIAGNOSIS — Z8249 Family history of ischemic heart disease and other diseases of the circulatory system: Secondary | ICD-10-CM

## 2024-02-19 MED ORDER — NITROGLYCERIN 0.4 MG SL SUBL: 0.4000 mg | SUBLINGUAL_TABLET | SUBLINGUAL | 3 refills | Status: AC | PRN

## 2024-02-19 MED ORDER — CHLORTHALIDONE 12.5 MG PO TABS: 12.5000 mg | ORAL_TABLET | Freq: Every day | ORAL | 3 refills | Status: DC

## 2024-02-19 NOTE — Patient Instructions (Signed)
 Medication Instructions:  STOP LASIX   STOP CLONIDINE  CONTINUE AMLODIPINE  10 MG DAILY CONTINUE METOPROLOL  SUCCINATE 50 MG DAILY START CHLORTHALIDONE  12.5 MG DAILY START NITROGLYCERIN  0.4 MG SUBLINGUAL AS NEEDED FOR CHEST PAIN *If you need a refill on your cardiac medications before your next appointment, please call your pharmacy*  Lab Work: BMP, A1C,SEDIMENTATION RATE,CRP AND TSH TODAY If you have labs (blood work) drawn today and your tests are completely normal, you will receive your results only by: MyChart Message (if you have MyChart) OR A paper copy in the mail If you have any lab test that is abnormal or we need to change your treatment, we will call you to review the results.  Testing/Procedures:[address] Your physician has requested that you have an echocardiogram. Echocardiography is a painless test that uses sound waves to create images of your heart. It provides your doctor with information about the size and shape of your heart and how well your heart's chambers and valves are working. This procedure takes approximately one hour. There are no restrictions for this procedure. Please do NOT wear cologne, perfume, aftershave, or lotions (deodorant is allowed). Please arrive 15 minutes prior to your appointment time.  Please note: We ask at that you not bring children with you during ultrasound (echo/ vascular) testing. Due to room size and safety concerns, children are not allowed in the ultrasound rooms during exams. Our front office staff cannot provide observation of children in our lobby area while testing is being conducted. An adult accompanying a patient to their appointment will only be allowed in the ultrasound room at the discretion of the ultrasound technician under special circumstances. We apologize for any inconvenience.   [address] Your physician has requested that you have an abdominal aorta duplex. During this test, an ultrasound is used to evaluate  the aorta. Allow 30 minutes for this exam. Do not eat after midnight the day before and avoid carbonated beverages.  Please note: We ask at that you not bring children with you during ultrasound (echo/ vascular) testing. Due to room size and safety concerns, children are not allowed in the ultrasound rooms during exams. Our front office staff cannot provide observation of children in our lobby area while testing is being conducted. An adult accompanying a patient to their appointment will only be allowed in the ultrasound room at the discretion of the ultrasound technician under special circumstances. We apologize for any inconvenience.   Follow-Up: At [facility], you and your health needs are our priority.  As part of our continuing mission to provide you with exceptional heart care, our providers are all part of one team.  This team includes your primary Cardiologist (physician) and Advanced Practice Providers or APPs (Physician Assistants and Nurse Practitioners) who all work together to provide you with the care you need, when you need it.  Your next appointment:   FOLLOW UP March 18, 2024   Provider:   Dorn Lesches, MD   Other Instructions CHECK BLOOD PRESSURE DAILY AND KEEP LOG OF READINGS, BRING LOG TO NEXT OFFICE VISIT.

## 2024-02-21 ENCOUNTER — Encounter: Payer: Self-pay | Admitting: Cardiovascular Disease

## 2024-02-21 LAB — C-REACTIVE PROTEIN: CRP: 3 mg/L (ref 0–10)

## 2024-02-21 LAB — BASIC METABOLIC PANEL WITH GFR
BUN/Creatinine Ratio: 16 (ref 9–23)
BUN: 13 mg/dL (ref 6–24)
CO2: 22 mmol/L (ref 20–29)
Calcium: 10 mg/dL (ref 8.7–10.2)
Chloride: 102 mmol/L (ref 96–106)
Creatinine, Ser: 0.81 mg/dL (ref 0.57–1.00)
Glucose: 109 mg/dL — ABNORMAL HIGH (ref 70–99)
Potassium: 4.1 mmol/L (ref 3.5–5.2)
Sodium: 142 mmol/L (ref 134–144)
eGFR: 84 mL/min/1.73 (ref >59)

## 2024-02-21 LAB — HEMOGLOBIN A1C
Est. average glucose Bld gHb Est-mCnc: 97 mg/dL
Hgb A1c MFr Bld: 5 % (ref 4.8–5.6)

## 2024-02-21 LAB — SEDIMENTATION RATE: Sed Rate: 5 mm/h (ref 0–40)

## 2024-02-21 LAB — TSH: TSH: 0.743 u[IU]/mL (ref 0.450–4.500)

## 2024-02-24 ENCOUNTER — Ambulatory Visit: Payer: Self-pay | Admitting: Physician Assistant

## 2024-03-02 ENCOUNTER — Other Ambulatory Visit: Payer: Self-pay | Admitting: *Deleted

## 2024-03-02 ENCOUNTER — Ambulatory Visit (HOSPITAL_COMMUNITY)
Admission: RE | Admit: 2024-03-02 | Discharge: 2024-03-02 | Disposition: A | Discharge status: Home / Self Care | Source: Ambulatory Visit | Attending: Physician Assistant | Admitting: Physician Assistant

## 2024-03-02 DIAGNOSIS — Z136 Encounter for screening for cardiovascular disorders: Secondary | ICD-10-CM | POA: Insufficient documentation

## 2024-03-02 DIAGNOSIS — I10 Essential (primary) hypertension: Secondary | ICD-10-CM | POA: Insufficient documentation

## 2024-03-02 DIAGNOSIS — Z8249 Family history of ischemic heart disease and other diseases of the circulatory system: Secondary | ICD-10-CM | POA: Diagnosis not present

## 2024-03-02 MED ORDER — HYDROCHLOROTHIAZIDE 12.5 MG PO CAPS
12.5000 mg | ORAL_CAPSULE | Freq: Every day | ORAL | 3 refills | Status: AC
Start: 2024-03-02 — End: 2024-05-31

## 2024-03-02 NOTE — Progress Notes (Signed)
 Called and made patient aware to start 12.5 mg hydrochlorothiazide  instead instead of Chlorthalidone  . Understanding verbalized.

## 2024-03-13 ENCOUNTER — Ambulatory Visit (HOSPITAL_COMMUNITY)
Admission: RE | Admit: 2024-03-13 | Discharge: 2024-03-13 | Disposition: A | Discharge status: Home / Self Care | Source: Ambulatory Visit | Attending: Physician Assistant | Admitting: Physician Assistant

## 2024-03-13 DIAGNOSIS — R0602 Shortness of breath: Secondary | ICD-10-CM | POA: Diagnosis present

## 2024-03-13 DIAGNOSIS — R079 Chest pain, unspecified: Secondary | ICD-10-CM | POA: Diagnosis present

## 2024-03-13 LAB — ECHOCARDIOGRAM COMPLETE
AR max vel: 1.98 cm2
AV Area VTI: 1.95 cm2
AV Area mean vel: 1.89 cm2
AV Mean grad: 6 mmHg
AV Peak grad: 10.9 mmHg
Ao pk vel: 1.65 m/s
Area-P 1/2: 2.84 cm2
S' Lateral: 2.15 cm

## 2024-03-18 ENCOUNTER — Ambulatory Visit: Attending: Cardiovascular Disease | Admitting: Cardiovascular Disease

## 2024-03-18 ENCOUNTER — Encounter: Payer: Self-pay | Admitting: Cardiovascular Disease

## 2024-03-18 VITALS — BP 122/90 | HR 64 | Ht 68.0 in | Wt 229.6 lb

## 2024-03-18 DIAGNOSIS — R0789 Other chest pain: Secondary | ICD-10-CM | POA: Diagnosis not present

## 2024-03-18 DIAGNOSIS — I517 Cardiomegaly: Secondary | ICD-10-CM | POA: Insufficient documentation

## 2024-03-18 DIAGNOSIS — I10 Essential (primary) hypertension: Secondary | ICD-10-CM | POA: Diagnosis not present

## 2024-03-18 DIAGNOSIS — I7143 Infrarenal abdominal aortic aneurysm, without rupture: Secondary | ICD-10-CM

## 2024-03-18 DIAGNOSIS — I714 Abdominal aortic aneurysm, without rupture, unspecified: Secondary | ICD-10-CM | POA: Insufficient documentation

## 2024-03-18 DIAGNOSIS — R079 Chest pain, unspecified: Secondary | ICD-10-CM | POA: Diagnosis not present

## 2024-03-18 MED ORDER — LOSARTAN POTASSIUM 25 MG PO TABS: 25.0000 mg | ORAL_TABLET | Freq: Every day | ORAL | 3 refills | Status: AC

## 2024-03-18 NOTE — Progress Notes (Signed)
 03/18/2024 Leah Moore   11-02-1965  [id number]  Primary Physician Sun, Vyvyan, MD Primary Cardiologist: Dorn JINNY Lesches MD GENI CODY MADEIRA, FSCAI  HPI:  Leah Moore is a 58 y.o. 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  She was seen as a patient first new patient evaluation by Mercy Hails PA-C 02/19/2024.  She has seen Dr. Dominick in the past.  Her risk factors include treated hypertension.  Her mother had an MI at age 21.  She is never had a heart attack or stroke.  She denies shortness of breath but has had some chest pressure in the recent past.  Recent 2D echo performed 03/13/2024 showed normal LV systolic function, grade 1 diastolic dysfunction with severe concentric LVH.  Abdominal ultrasound showed a generous abdominal aorta measuring 2.8 cm.  I reviewed her blood pressure log at home which reveals diastolic blood pressures in the high 80s/low 90 mmHg range.   Current Meds  Medication Sig   amLODipine  (NORVASC ) 10 MG tablet amlodipine  10 mg tablet  Take 1 tablet every day by oral route.   aspirin EC 81 MG tablet Take 81 mg by mouth every morning.   cetirizine (ZYRTEC) 10 MG tablet Take 10 mg by mouth at bedtime.    dorzolamide (TRUSOPT) 2 % ophthalmic solution Place 1 drop into both eyes 2 (two) times daily.   hydrochlorothiazide  (MICROZIDE ) 12.5 MG capsule Take 1 capsule (12.5 mg total) by mouth daily.   ibuprofen  (ADVIL ) 800 MG tablet Take 800 mg by mouth every 8 (eight) hours as needed for moderate pain (pain score 4-6).   latanoprost  (XALATAN ) 0.005 % ophthalmic solution Place 1 drop into both eyes at bedtime.    losartan  (COZAAR ) 25 MG tablet Take 1 tablet (25 mg total) by mouth daily.   metoprolol  succinate (TOPROL -XL) 50 MG 24 hr tablet Take 50 mg by mouth daily.   potassium chloride  SA (K-DUR,KLOR-CON ) 20 MEQ tablet Take 1 tablet (20 mEq  total) by mouth every morning.   sertraline (ZOLOFT) 100 MG tablet Take 100 mg by mouth daily.     Allergies  Allergen Reactions   Lisinopril Other (See Comments)    Facial Edema (Lips)  Other Reaction(s): facial swelling   Lisinopril-Hydrochlorothiazide      Other Reaction(s): angioedema   Oxycodone -Acetaminophen  Itching and Swelling    Other Reaction(s): Dizziness    Social History   Socioeconomic History   Marital status: Single    Spouse name: Not on file   Number of children: Not on file   Years of education: Not on file   Highest education level: Not on file  Occupational History   Not on file  Tobacco Use   Smoking status: Never   Smokeless tobacco: Never   Tobacco comments:    social alcohol  Substance and Sexual Activity   Alcohol use: Yes    Comment: socially   Drug use: No   Sexual activity: Yes    Birth control/protection: Condom  Other Topics Concern   Not on file  Social History Narrative   Not on file   Social Drivers of Health   Financial Resource Strain: Not on file  Food Insecurity: Not on file  Transportation Needs: Not on file  Physical Activity: Not on file  Stress: Not on file  Social Connections: Not on file  Intimate Partner Violence: Not on file  Review of Systems: General: negative for chills, fever, night sweats or weight changes.  Cardiovascular: negative for chest pain, dyspnea on exertion, edema, orthopnea, palpitations, paroxysmal nocturnal dyspnea or shortness of breath Dermatological: negative for rash Respiratory: negative for cough or wheezing Urologic: negative for hematuria Abdominal: negative for nausea, vomiting, diarrhea, bright red blood per rectum, melena, or hematemesis Neurologic: negative for visual changes, syncope, or dizziness All other systems reviewed and are otherwise negative except as noted above.    Blood pressure (!) 122/90, pulse 64, height 5' 8 (1.727 m), weight 229 lb 9.6 oz (104.1 kg), SpO2  96%.  General appearance: alert and no distress Neck: no adenopathy, no carotid bruit, no JVD, supple, symmetrical, trachea midline, and thyroid not enlarged, symmetric, no tenderness/mass/nodules Lungs: clear to auscultation bilaterally Heart: regular rate and rhythm, S1, S2 normal, no murmur, click, rub or gallop Extremities: extremities normal, atraumatic, no cyanosis or edema Pulses: 2+ and symmetric Skin: Skin color, texture, turgor normal. No rashes or lesions Neurologic: Grossly normal  EKG not performed today      ASSESSMENT AND PLAN:   HTN (hypertension) History of essential hypertension with blood pressure measured today at 122/90.  She is on amlodipine , hydrochlorothiazide  and metoprolol .  I reviewed her blood pressure log and she does have diastolics in the high 80s to low 90 range at home.  I am going to add low-dose losartan  25 mg and will have her keep a 30 blood pressure log and see a Pharm.D. after that.  Will provide her with a salty 6 diet.  I suspect her severe LVH is a result of her longstanding hypertension.  Chest pressure Patient has had several episodes of chest pressure.  They do not necessarily sound anginal.  I am going to get a coronary calcium score to further evaluate.  Severe left ventricular hypertrophy Recent 2D echo performed 03/13/2024 showed severe left ventricular hypertrophy with grade 1 diastolic dysfunction.  This is most likely related to longstanding hypertension.  AAA (abdominal aortic aneurysm) without rupture (HCC) Recent abdominal ultrasound performed 03/02/2024 revealed a maximum dimension of abdominal aorta of 2.8 cm.  This does not quite meet criteria for abdominal aortic aneurysm but should probably be followed in the future.     Dorn DOROTHA Lesches MD FACP,FACC,FAHA, [facility] 03/18/2024 4:43 PM

## 2024-03-18 NOTE — Assessment & Plan Note (Signed)
 Recent 2D echo performed 03/13/2024 showed severe left ventricular hypertrophy with grade 1 diastolic dysfunction.  This is most likely related to longstanding hypertension.

## 2024-03-18 NOTE — Patient Instructions (Signed)
 Medication Instructions:  Your physician has recommended you make the following change in your medication:   -Start losartan  (cozaar ) 25mg  once daily.  *If you need a refill on your cardiac medications before your next appointment, please call your pharmacy*  Testing/Procedures: Dr. Court has ordered a CT coronary calcium score.   Test locations:  [facility] at [facility]  [facility] at [facility]  This is $99 out of pocket.   Coronary CalciumScan A coronary calcium scan is an imaging test used to look for deposits of calcium and other fatty materials (plaques) in the inner lining of the blood vessels of the heart (coronary arteries). These deposits of calcium and plaques can partly clog and narrow the coronary arteries without producing any symptoms or warning signs. This puts a person at risk for a heart attack. This test can detect these deposits before symptoms develop. Tell a health care provider about: Any allergies you have. All medicines you are taking, including vitamins, herbs, eye drops, creams, and over-the-counter medicines. Any problems you or family members have had with anesthetic medicines. Any blood disorders you have. Any surgeries you have had. Any medical conditions you have. Whether you are pregnant or may be pregnant. What are the risks? Generally, this is a safe procedure. However, problems may occur, including: Harm to a pregnant woman and her unborn baby. This test involves the use of radiation. Radiation exposure can be dangerous to a pregnant woman and her unborn baby. If you are pregnant, you generally should not have this procedure done. Slight increase in the risk of cancer. This is because of the radiation involved in the test. What happens before the procedure? No preparation is needed for this procedure. What happens during the procedure? You  will undress and remove any jewelry around your neck or chest. You will put on a hospital gown. Sticky electrodes will be placed on your chest. The electrodes will be connected to an electrocardiogram (ECG) machine to record a tracing of the electrical activity of your heart. A CT scanner will take pictures of your heart. During this time, you will be asked to lie still and hold your breath for 2-3 seconds while a picture of your heart is being taken. The procedure may vary among health care providers and hospitals. What happens after the procedure? You can get dressed. You can return to your normal activities. It is up to you to get the results of your test. Ask your health care provider, or the department that is doing the test, when your results will be ready. Summary A coronary calcium scan is an imaging test used to look for deposits of calcium and other fatty materials (plaques) in the inner lining of the blood vessels of the heart (coronary arteries). Generally, this is a safe procedure. Tell your health care provider if you are pregnant or may be pregnant. No preparation is needed for this procedure. A CT scanner will take pictures of your heart. You can return to your normal activities after the scan is done. This information is not intended to replace advice given to you by your health care provider. Make sure you discuss any questions you have with your health care provider. Document Released: 01/19/2008 Document Revised: 06/11/2016 Document Reviewed: 06/11/2016 Elsevier Interactive Patient Education  2017 [employer].   Follow-Up: At [facility], you and your health needs are our priority.  As part of our  continuing mission to provide you with exceptional heart care, our providers are all part of one team.  This team includes your primary Cardiologist (physician) and Advanced Practice Providers or APPs (Physician Assistants and Nurse Practitioners) who all work together  to provide you with the care you need, when you need it.  Your next appointment:   3 month(s)  Provider:   Jon Hails, PA-C         Then, Dorn Lesches, MD will plan to see you again in 12 month(s).   We recommend signing up for the patient portal called MyChart.  Sign up information is provided on this After Visit Summary.  MyChart is used to connect with patients for Virtual Visits (Telemedicine).  Patients are able to view lab/test results, encounter notes, upcoming appointments, etc.  Non-urgent messages can be sent to your provider as well.   To learn more about what you can do with MyChart, go to [email or web address].   Other Instructions Dr. Lesches has requested that you schedule an appointment with one of our clinical pharmacists for a blood pressure check appointment within the next 5-6 weeks.  If you monitor your blood pressure (BP) at home, please bring your BP cuff and your BP readings with you to this appointment  HOW TO TAKE YOUR BLOOD PRESSURE: Rest 5 minutes before taking your blood pressure. Don't smoke or drink caffeinated beverages for at least 30 minutes before. Take your blood pressure before (not after) you eat. Sit comfortably with your back supported and both feet on the floor (don't cross your legs). Elevate your arm to heart level on a table or a desk. Use the proper sized cuff. It should fit smoothly and snugly around your bare upper arm. There should be enough room to slip a fingertip under the cuff. The bottom edge of the cuff should be 1 inch above the crease of the elbow. Ideally, take 3 measurements at one sitting and record the average.    The Salty Six:

## 2024-03-18 NOTE — Assessment & Plan Note (Signed)
 History of essential hypertension with blood pressure measured today at 122/90.  She is on amlodipine , hydrochlorothiazide  and metoprolol .  I reviewed her blood pressure log and she does have diastolics in the high 80s to low 90 range at home.  I am going to add low-dose losartan  25 mg and will have her keep a 30 blood pressure log and see a Pharm.D. after that.  Will provide her with a salty 6 diet.  I suspect her severe LVH is a result of her longstanding hypertension.

## 2024-03-18 NOTE — Assessment & Plan Note (Signed)
 Patient has had several episodes of chest pressure.  They do not necessarily sound anginal.  I am going to get a coronary calcium score to further evaluate.

## 2024-03-18 NOTE — Assessment & Plan Note (Signed)
 Recent abdominal ultrasound performed 03/02/2024 revealed a maximum dimension of abdominal aorta of 2.8 cm.  This does not quite meet criteria for abdominal aortic aneurysm but should probably be followed in the future.

## 2024-03-26 ENCOUNTER — Ambulatory Visit (HOSPITAL_COMMUNITY)
Admission: RE | Admit: 2024-03-26 | Discharge: 2024-03-26 | Disposition: A | Discharge status: Home / Self Care | Payer: Self-pay | Source: Ambulatory Visit | Attending: Cardiology | Admitting: Cardiology

## 2024-03-26 DIAGNOSIS — R079 Chest pain, unspecified: Secondary | ICD-10-CM | POA: Insufficient documentation

## 2024-03-26 DIAGNOSIS — I10 Essential (primary) hypertension: Secondary | ICD-10-CM | POA: Insufficient documentation

## 2024-03-27 ENCOUNTER — Ambulatory Visit: Payer: Self-pay | Admitting: Cardiovascular Disease

## 2024-03-30 ENCOUNTER — Other Ambulatory Visit (HOSPITAL_BASED_OUTPATIENT_CLINIC_OR_DEPARTMENT_OTHER): Payer: Self-pay

## 2024-04-23 ENCOUNTER — Ambulatory Visit: Attending: Internal Medicine | Admitting: Pharmacist

## 2024-04-23 ENCOUNTER — Encounter: Payer: Self-pay | Admitting: Pharmacist

## 2024-04-23 VITALS — BP 119/84 | HR 82

## 2024-04-23 DIAGNOSIS — I10 Essential (primary) hypertension: Secondary | ICD-10-CM

## 2024-04-23 NOTE — Patient Instructions (Addendum)
 Changes made by your pharmacist Marquavious Nazar E. Hommer Cunliffe, PharmD at today's visit:    Instructions/Changes  (what do you need to do) Your Notes  (what you did and when you did it)  Continue amlodipine  10 mg daily, hydrochlorothiazide  12.5 mg daily, losartan  25 mg daily and metoprolol  succinate 50 mg daily   2. Check blood pressure + heart rate daily      and bring log to next appointment on      06/01/24 at 8:45 AM   3. Bring blood pressure cuff to next       appointment on 06/01/24 at 8:45 AM    Bring all of your meds, your BP cuff and your record of home blood pressures to your next appointment.    HOW TO TAKE YOUR BLOOD PRESSURE AT HOME  Rest 5 minutes before taking your blood pressure.  Don't smoke or drink caffeinated beverages for at least 30 minutes before. Take your blood pressure before (not after) you eat. Sit comfortably with your back supported and both feet on the floor (don't cross your legs). Elevate your arm to heart level on a table or a desk. Use the proper sized cuff. It should fit smoothly and snugly around your bare upper arm. There should be enough room to slip a fingertip under the cuff. The bottom edge of the cuff should be 1 inch above the crease of the elbow. Ideally, take 3 measurements at one sitting and record the average.  Important lifestyle changes to control high blood pressure  Intervention  Effect on the BP  Lose extra pounds and watch your waistline Weight loss is one of the most effective lifestyle changes for controlling blood pressure. If you're overweight or obese, losing even a small amount of weight can help reduce blood pressure. Blood pressure might go down by about 1 millimeter of mercury (mm Hg) with each kilogram (about 2.2 pounds) of weight lost.  Exercise regularly As a general goal, aim for at least 30 minutes of moderate physical activity every day. Regular physical activity can lower high blood pressure by about 5 to 8 mm Hg.  Eat a healthy  diet Eating a diet rich in whole grains, fruits, vegetables, and low-fat dairy products and low in saturated fat and cholesterol. A healthy diet can lower high blood pressure by up to 11 mm Hg.  Reduce salt (sodium) in your diet Even a small reduction of sodium in the diet can improve heart health and reduce high blood pressure by about 5 to 6 mm Hg.  Limit alcohol One drink equals 12 ounces of beer, 5 ounces of wine, or 1.5 ounces of 80-proof liquor.  Limiting alcohol to less than one drink a day for women or two drinks a day for men can help lower blood pressure by about 4 mm Hg.   DASH Eating Plan DASH stands for Dietary Approaches to Stop Hypertension. The DASH eating plan is a healthy eating plan that has been shown to: Lower high blood pressure (hypertension). Reduce your risk for type 2 diabetes, heart disease, and stroke. Help with weight loss. What are tips for following this plan? Reading food labels Check food labels for the amount of salt (sodium) per serving. Choose foods with less than 5 percent of the Daily Value (DV) of sodium. In general, foods with less than 300 milligrams (mg) of sodium per serving fit into this eating plan. To find whole grains, look for the word whole as the first word in the  ingredient list. Shopping Buy products labeled as low-sodium or no salt added. Buy fresh foods. Avoid canned foods and pre-made or frozen meals. Cooking Try not to add salt when you cook. Use salt-free seasonings or herbs instead of table salt or sea salt. Check with your health care provider or pharmacist before using salt substitutes. Do not fry foods. Cook foods in healthy ways, such as baking, boiling, grilling, roasting, or broiling. Cook using oils that are good for your heart. These include olive, canola, avocado, soybean, and sunflower oil. Meal planning  Eat a balanced diet. This should include: 4 or more servings of fruits and 4 or more servings of vegetables each  day. Try to fill half of your plate with fruits and vegetables. 6-8 servings of whole grains each day. 6 or less servings of lean meat, poultry, or fish each day. 1 oz is 1 serving. A 3 oz (85 g) serving of meat is about the same size as the palm of your hand. One egg is 1 oz (28 g). 2-3 servings of low-fat dairy each day. One serving is 1 cup (237 mL). 1 serving of nuts, seeds, or beans 5 times each week. 2-3 servings of heart-healthy fats. Healthy fats called omega-3 fatty acids are found in foods such as walnuts, flaxseeds, fortified milks, and eggs. These fats are also found in cold-water  fish, such as sardines, salmon, and mackerel. Limit how much you eat of: Canned or prepackaged foods. Food that is high in trans fat, such as fried foods. Food that is high in saturated fat, such as fatty meat. Desserts and other sweets, sugary drinks, and other foods with added sugar. Full-fat dairy products. Do not salt foods before eating. Do not eat more than 4 egg yolks a week. Try to eat at least 2 vegetarian meals a week. Eat more home-cooked food and less restaurant, buffet, and fast food. Lifestyle When eating at a restaurant, ask if your food can be made with less salt or no salt. If you drink alcohol: Limit how much you have to: 0-1 drink a day if you are female. 0-2 drinks a day if you are female. Know how much alcohol is in your drink. In the [place] one drink is one 12 oz bottle of beer (355 mL), one 5 oz glass of wine (148 mL), or one 1 oz glass of hard liquor (44 mL). General information Avoid eating more than 2,300 mg of salt a day. If you have hypertension, you may need to reduce your sodium intake to 1,500 mg a day. Work with your provider to stay at a healthy body weight or lose weight. Ask what the best weight range is for you. On most days of the week, get at least 30 minutes of exercise that causes your heart to beat faster. This may include walking, swimming, or biking. Work with  your provider or dietitian to adjust your eating plan to meet your specific calorie needs. What foods should I eat? Fruits All fresh, dried, or frozen fruit. Canned fruits that are in their natural juice and do not have sugar added to them. Vegetables Fresh or frozen vegetables that are raw, steamed, roasted, or grilled. Low-sodium or reduced-sodium tomato and vegetable juice. Low-sodium or reduced-sodium tomato sauce and tomato paste. Low-sodium or reduced-sodium canned vegetables. Grains Whole-grain or whole-wheat bread. Whole-grain or whole-wheat pasta. Brown rice. Mcneil Madeira. Bulgur. Whole-grain and low-sodium cereals. Pita bread. Low-fat, low-sodium crackers. Whole-wheat flour tortillas. Meats and other proteins Skinless chicken or [place].  Ground chicken or [place]. Pork with fat trimmed off. Fish and seafood. Egg whites. Dried beans, peas, or lentils. Unsalted nuts, nut butters, and seeds. Unsalted canned beans. Lean cuts of beef with fat trimmed off. Low-sodium, lean precooked or cured meat, such as sausages or meat loaves. Dairy Low-fat (1%) or fat-free (skim) milk. Reduced-fat, low-fat, or fat-free cheeses. Nonfat, low-sodium ricotta or cottage cheese. Low-fat or nonfat yogurt. Low-fat, low-sodium cheese. Fats and oils Soft margarine without trans fats. Vegetable oil. Reduced-fat, low-fat, or light mayonnaise and salad dressings (reduced-sodium). Canola, safflower, olive, avocado, soybean, and sunflower oils. Avocado. Seasonings and condiments Herbs. Spices. Seasoning mixes without salt. Other foods Unsalted popcorn and pretzels. Fat-free sweets. The items listed above may not be all the foods and drinks you can have. Talk to a dietitian to learn more. What foods should I avoid? Fruits Canned fruit in a light or heavy syrup. Fried fruit. Fruit in cream or butter sauce. Vegetables Creamed or fried vegetables. Vegetables in a cheese sauce. Regular canned vegetables that are not  marked as low-sodium or reduced-sodium. Regular canned tomato sauce and paste that are not marked as low-sodium or reduced-sodium. Regular tomato and vegetable juices that are not marked as low-sodium or reduced-sodium. Dene. Olives. Grains Baked goods made with fat, such as croissants, muffins, or some breads. Dry pasta or rice meal packs. Meats and other proteins Fatty cuts of meat. Ribs. Fried meat. [place]. Bologna, salami, and other precooked or cured meats, such as sausages or meat loaves, that are not lean and low in sodium. Fat from the back of a pig (fatback). Bratwurst. Salted nuts and seeds. Canned beans with added salt. Canned or smoked fish. Whole eggs or egg yolks. Chicken or [place] with skin. Dairy Whole or 2% milk, cream, and half-and-half. Whole or full-fat cream cheese. Whole-fat or sweetened yogurt. Full-fat cheese. Nondairy creamers. Whipped toppings. Processed cheese and cheese spreads. Fats and oils Butter. Stick margarine. Lard. Shortening. Ghee. Bacon fat. Tropical oils, such as coconut, palm kernel, or palm oil. Seasonings and condiments Onion salt, garlic salt, seasoned salt, table salt, and sea salt. Worcestershire sauce. Tartar sauce. Barbecue sauce. Teriyaki sauce. Soy sauce, including reduced-sodium soy sauce. Steak sauce. Canned and packaged gravies. Fish sauce. Oyster sauce. Cocktail sauce. Store-bought horseradish. Ketchup. Mustard. Meat flavorings and tenderizers. Bouillon cubes. Hot sauces. Pre-made or packaged marinades. Pre-made or packaged taco seasonings. Relishes. Regular salad dressings. Other foods Salted popcorn and pretzels. The items listed above may not be all the foods and drinks you should avoid. Talk to a dietitian to learn more. Where to find more information National Heart, Lung, and Blood Institute (NHLBI): BuffaloDryCleaner.gl American Heart Association (AHA): heart.org Academy of Nutrition and Dietetics: eatright.org National Kidney Foundation (NKF):  kidney.org This information is not intended to replace advice given to you by your health care provider. Make sure you discuss any questions you have with your health care provider. Document Revised: 08/09/2022 Document Reviewed: 08/09/2022 Elsevier Patient Education  2024 [employer]. If you have any questions or concerns please use My Chart to send questions or call the office at [phone]

## 2024-04-23 NOTE — Progress Notes (Signed)
 Patient ID: Leah Moore                 DOB: 1966-06-01                      MRN: [id number]      HPI: GISSELLA NIBLACK is a 58 y.o. female referred by Dr. Court to HTN clinic. PMH is significant for HTN, AAA, obesity, arthritis and GERD.  Patient presents to HTN clinic. Pt was last seen by Dr. Court on 03/2024 where he added losartan  25 mg after noticing diastolics in the high 80s to low 90s range at home. Patient did not bring in her BP machine or log. She has been checking her blood pressure at home and recalls it was 131/91 last night. She hasn't noticed much improvement in BP since losartan  was added. She states that she has been experiencing some slight headaches since starting losartan  but attributes it to her getting used to drug.She denies any SOB, or chest pain. She experiences dizziness sometimes but she reports having a history of vertigo. She is curious if sleep deficiency may affect her blood pressure.   She states that she needs to make improvements in her diet as she consumes a lot of salty and sugary foods. She even craves salty food particuarly in the middle of the night.   Current HTN meds: amlodipine  10 mg daily, hydrochlorothiazide  12.5 mg daily, losartan  25 mg daily, metoprolol  succinate 50 mg daily Previously tried: lisinopril: angioedema; lisinopril-hydrochlorothiazide : angioedema BP goal: <130/80 Scr (02/2024): 0.81 mg/dL Potassium (09/7972): 4.1 mg/dL Sodium (09/7972): 857 mg/dL  Family History:  Relation Problem Comments  Mother (Alive) Arthritis   COPD   Diabetes   Glaucoma   Hypertension   Sleep apnea     Father (Alive) Hypertension   Stroke     Brother Glaucoma   Hypertension     Paternal Uncle Colon cancer     Paternal Uncle Kidney cancer   Prostate cancer     Maternal Grandmother Heart disease   Stroke     Maternal Grandfather COPD   Heart disease     Paternal Grandmother Arthritis   Hypertension   Pancreatic cancer     Paternal Grandfather  Heart disease     Other - PGaunt (Deceased) Stomach cancer     Other - PGuncle (Deceased) Prostate cancer     Other - PGuncle (Deceased) Lung cancer      Social History:  Alcohol: none; occasionally she'll drink 1 beer Smoking: none  Diet:  Snacks - potato chips, sweet potato chips, chex mix, lance crackers with peanut butter, almonds and pecans with salt Breakfast- greek yogurt (lactose free) w/pecans/cereal sometimes, two boiled eggs and bacon Lunch - chick fil a or zaxbys  Dinner - tony's seasoning, collards, turnips, potoes, zuchhini, squash, cabbage Drinks: [place] dry ginger ale, welch's grape juice, pineapple juice, 4 bottles of water  Patient is aware that she needs to improve her eating habits and consume foods lower in salt. Patient is willing to decrease potato chips and this time and work on substituting for healthier alternatives thereon   Exercise:  She walk 3-4x/week for about 30 minutes Part time job  Home BP readings: Pt did not bring in log but recalls BP from last night  Date SBP/DBP   04/22/24 131/91    Wt Readings from Last 3 Encounters:  03/18/24 229 lb 9.6 oz (104.1 kg)  02/19/24 229 lb 6.4 oz (104.1 kg)  10/02/18  227 lb (103 kg)   BP Readings from Last 3 Encounters:  04/23/24 119/84  03/18/24 (!) 122/90  02/19/24 (!) 126/96   Pulse Readings from Last 3 Encounters:  04/23/24 82  03/18/24 64  02/19/24 78    Renal function: CrCl cannot be calculated (Patient's most recent lab result is older than the maximum 21 days allowed.).  Past Medical History:  Diagnosis Date   Arthritis    Family history of breast cancer    Family history of colon cancer    Family history of colon cancer    Family history of pancreatic cancer    Family history of stomach cancer    GERD (gastroesophageal reflux disease)    Glaucoma    H/O echocardiogram 2014   Headache(784.0)    migraines   Hypertension    Mitral valve prolapse    Vestibular vertigo      Current Outpatient Medications on File Prior to Visit  Medication Sig Dispense Refill   amLODipine  (NORVASC ) 10 MG tablet amlodipine  10 mg tablet  Take 1 tablet every day by oral route.     aspirin EC 81 MG tablet Take 81 mg by mouth every morning.     cetirizine (ZYRTEC) 10 MG tablet Take 10 mg by mouth at bedtime.      dorzolamide (TRUSOPT) 2 % ophthalmic solution Place 1 drop into both eyes 2 (two) times daily.     hydrochlorothiazide  (MICROZIDE ) 12.5 MG capsule Take 1 capsule (12.5 mg total) by mouth daily. 90 capsule 3   ibuprofen  (ADVIL ) 800 MG tablet Take 800 mg by mouth every 8 (eight) hours as needed for moderate pain (pain score 4-6).     latanoprost  (XALATAN ) 0.005 % ophthalmic solution Place 1 drop into both eyes at bedtime.      losartan  (COZAAR ) 25 MG tablet Take 1 tablet (25 mg total) by mouth daily. 90 tablet 3   metoprolol  succinate (TOPROL -XL) 50 MG 24 hr tablet Take 50 mg by mouth daily.     nitroGLYCERIN  (NITROSTAT ) 0.4 MG SL tablet Place 1 tablet (0.4 mg total) under the tongue every 5 (five) minutes as needed for chest pain. (Patient not taking: Reported on 03/18/2024) 25 tablet 3   potassium chloride  SA (K-DUR,KLOR-CON ) 20 MEQ tablet Take 1 tablet (20 mEq total) by mouth every morning. 30 tablet 5   sertraline (ZOLOFT) 100 MG tablet Take 100 mg by mouth daily.     No current facility-administered medications on file prior to visit.    Allergies  Allergen Reactions   Lisinopril Other (See Comments)    Facial Edema (Lips)  Other Reaction(s): facial swelling   Lisinopril-Hydrochlorothiazide      Other Reaction(s): angioedema   Oxycodone -Acetaminophen  Itching and Swelling    Other Reaction(s): Dizziness    Blood pressure 119/84, pulse 82.   Assessment/Plan:  1. Hypertension -  HTN (hypertension) Assessment: Office BP (119/84) slightly above goal of <130/80 mmHg  Diastolic BP much lower at this visit than previous visit and home BP Other than slight  headache since starting losartan , tolerates BP medications well without any side effects Denies SOB, palpitation, chest pain or swelling Reiterated the importance of regular exercise, low salt diet and proper sleep No BP log provided to assess BP control since adding on losartan   Plan:  Continue taking amlodipine  10 mg daily, hydrochlorothiazide  12.5 mg daily, losartan  25 mg daily, metoprolol  succinate 50 mg daily at this time Patient to keep record of BP readings with heart rate and report to  us  at the next visit Instructed patient to bring in BP cuff to assess accuracy Provided DASH diet handout Counseled on healthy sleep habits  Will assess BP control at next visit with home log and implemented diet changes Patient to see PharmD in 4 weeks for follow up      I agree with assessment and plan.  Thank you  Lakedra E. White, Pharm.D [place] [facility]. [facility] [address], [place], [place] [address] Phone: [phone]; Fax: [phone]    Robbi Blanch, Pharm.D [place] [facility]. [facility] [address], [place], [place] [address] Phone: [phone]; Fax: [phone]

## 2024-04-23 NOTE — Assessment & Plan Note (Addendum)
 Assessment: Office BP (119/84) slightly above goal of <130/80 mmHg  Diastolic BP much lower at this visit than previous visit and home BP Other than slight headache since starting losartan , tolerates BP medications well without any side effects Denies SOB, palpitation, chest pain or swelling Reiterated the importance of regular exercise, low salt diet and proper sleep No BP log provided to assess BP control since adding on losartan   Plan:  Continue taking amlodipine  10 mg daily, hydrochlorothiazide  12.5 mg daily, losartan  25 mg daily, metoprolol  succinate 50 mg daily at this time Patient to keep record of BP readings with heart rate and report to us  at the next visit Instructed patient to bring in BP cuff to assess accuracy Provided DASH diet handout Counseled on healthy sleep habits  Will assess BP control at next visit with home log and implemented diet changes Patient to see PharmD in 4 weeks for follow up

## 2024-06-01 ENCOUNTER — Ambulatory Visit: Admitting: Pharmacist

## 2024-06-17 NOTE — Progress Notes (Deleted)
 Cardiology Office Note:    Date:  06/17/2024   ID:  Leah Moore, DOB 1966/06/15, MRN [id number]  PCP:  Sun, Vyvyan, MD   [facility] Providers Cardiologist:  Dorn Lesches, MD Cardiology APP:  Leah Leah Garre, PA { Click to update primary MD,subspecialty MD or APP then REFRESH:1}    Referring MD: Sun, Vyvyan, MD   No chief complaint on file. ***  History of Present Illness:    Leah Moore is a 58 y.o. female with a hx of remote mitral valve prolapse, longstanding HTN, family history of HTN/MI/thoracic aneurysm, obesity, low vitamin D  levels, glaucoma, depression/anxiety, migraines, vertigo, angioedema with ACEI seen for evaluation of chest pain at the request of Dr. Austin.  She carries a remote history of mitral valve prolapse, though last echo 2014 did not reveal such - EF 60-65%, G1DD otherwise. Nuc in 2014 showed hypertensive response to exercise but otherwise normal/low risk.  She saw PCP 02/03/24 and reported dyspnea, CP, and lower extremity swelling that resolved with elevation. No radiation or diaphoresis. She was referred to cardiology [facility]. Symptoms suspicious for resolving pericarditis, CRP and sed rate WNL.   Echo with preserved LVEF but severe concentric LVH felt likely related to long-standing hypertension, trivial pericardial effusion. She saw Dr. Lesches who opted to obtain coronary calcium score which was zero.   She presents back today for follow up.     Chest pain of uncertain etiology Coronary calcium score zero (03/2024) - chest pain likely not cardiac in nature   Essential HTN - continue 10 mg amlodipine , 12.5 mg hydrochlorothiazide , 25 mg losartan , 50 mg toprol     History of mitral valve prolapse - repeat echo does not show significant valvular disease   Family history of aneurysm - AAA with 2.8 cm aorta, confirmed on echo - continue BP control       Family History: Tobacco: Alcohol: Drug  use:  Labwork independently reviewed: C.E. 08/2023 Cr 0.78, K 4.3, LDL 95, trig 73 02/2023 A1C 4.9 2015 CBC wnl         Past Medical History:  Diagnosis Date   Arthritis    Family history of breast cancer    Family history of colon cancer    Family history of colon cancer    Family history of pancreatic cancer    Family history of stomach cancer    GERD (gastroesophageal reflux disease)    Glaucoma    H/O echocardiogram 2014   Headache(784.0)    migraines   Hypertension    Mitral valve prolapse    Vestibular vertigo     Past Surgical History:  Procedure Laterality Date   CHOLECYSTECTOMY N/A 06/30/2013   Procedure: LAPAROSCOPIC CHOLECYSTECTOMY WITH INTRAOPERATIVE CHOLANGIOGRAM;  Surgeon: Debby A. Cornett, MD;  Location: [facility] OR;  Service: General;  Laterality: N/A;   KNEE ARTHROSCOPY Left    58 yrs old   LAPAROSCOPIC APPENDECTOMY N/A 04/14/2014   Procedure: APPENDECTOMY LAPAROSCOPIC;  Surgeon: Bernarda Debby, MD;  Location: [facility];  Service: General;  Laterality: N/A;    Current Medications: No outpatient medications have been marked as taking for the 06/19/24 encounter (Appointment) with Leah Leah Garre, PA.     Allergies:   Lisinopril, Lisinopril-hydrochlorothiazide , and Oxycodone -acetaminophen    Social History   Socioeconomic History   Marital status: Single    Spouse name: Not on file   Number of children: Not on file   Years of education: Not on file   Highest education  level: Not on file  Occupational History   Not on file  Tobacco Use   Smoking status: Never   Smokeless tobacco: Never   Tobacco comments:    social alcohol  Substance and Sexual Activity   Alcohol use: Yes    Comment: socially   Drug use: No   Sexual activity: Yes    Birth control/protection: Condom  Other Topics Concern   Not on file  Social History Narrative   Not on file   Social Drivers of Health   Financial Resource Strain: Not on file  Food Insecurity: Not on file   Transportation Needs: Not on file  Physical Activity: Not on file  Stress: Not on file  Social Connections: Not on file     Family History: The patient's ***family history includes Arthritis in her mother and paternal grandmother; COPD in her maternal grandfather and mother; Colon cancer in her paternal uncle; Diabetes in her mother; Glaucoma in her brother and mother; Heart disease in her maternal grandfather, maternal grandmother, and paternal grandfather; Hypertension in her brother, father, mother, and paternal grandmother; Kidney cancer in her paternal uncle; Lung cancer in an other family member; Pancreatic cancer in her paternal grandmother; Prostate cancer in her paternal uncle and another family member; Sleep apnea in her mother; Stomach cancer in an other family member; Stroke in her father and maternal grandmother.  ROS:   Please see the history of present illness.    *** All other systems reviewed and are negative.  EKGs/Labs/Other Studies Reviewed:    The following studies were reviewed today: ***      Recent Labs: 02/19/2024: BUN 13; Creatinine, Ser 0.81; Potassium 4.1; Sodium 142; TSH 0.743  Recent Lipid Panel    Component Value Date/Time   CHOL 151 05/12/2014 1454   TRIG 146 05/12/2014 1454   HDL 53 05/12/2014 1454   CHOLHDL 2.8 05/12/2014 1454   VLDL 29 05/12/2014 1454   LDLCALC 69 05/12/2014 1454     Risk Assessment/Calculations:   {Does this patient have ATRIAL FIBRILLATION?:[id number]}  No BP recorded.  {Refresh Note OR Click here to enter BP  :1}***         Physical Exam:    VS:  There were no vitals taken for this visit.    Wt Readings from Last 3 Encounters:  03/18/24 229 lb 9.6 oz (104.1 kg)  02/19/24 229 lb 6.4 oz (104.1 kg)  10/02/18 227 lb (103 kg)     GEN: *** Well nourished, well developed in no acute distress HEENT: Normal NECK: No JVD; No carotid bruits LYMPHATICS: No lymphadenopathy CARDIAC: ***RRR, no murmurs, rubs,  gallops RESPIRATORY:  Clear to auscultation without rales, wheezing or rhonchi  ABDOMEN: Soft, non-tender, non-distended MUSCULOSKELETAL:  No edema; No deformity  SKIN: Warm and dry NEUROLOGIC:  Alert and oriented x 3 PSYCHIATRIC:  Normal affect   ASSESSMENT:    No diagnosis found. PLAN:    In order of problems listed above:  ***      {Are you ordering a CV Procedure (e.g. stress test, cath, DCCV, TEE, etc)?   Press F2        :[id number]}    Medication Adjustments/Labs and Tests Ordered: Current medicines are reviewed at length with the patient today.  Concerns regarding medicines are outlined above.  No orders of the defined types were placed in this encounter.  No orders of the defined types were placed in this encounter.   There are no Patient Instructions on file for this  visit.   Signed, Leah Nat Hails, PA  06/17/2024 2:32 PM    [facility]

## 2024-06-19 ENCOUNTER — Ambulatory Visit: Admitting: Physician Assistant

## 2024-06-30 NOTE — Progress Notes (Deleted)
 Patient ID: Leah Moore                 DOB: 10-25-1965                      MRN: [id number]      HPI: Leah Moore is a 58 y.o. female referred by Dr. Court to HTN clinic. PMH is significant for HTN, AAA, obesity, arthritis and GERD. At alst visit with me on 04/23/2024 no meds were changed. Patient was advised to keep log for BP at home and bring at the next visit. We discussed lots of lifestyle interventions emphasizes on lowering salt intake. Pt cancelled follow up apt with me on 06/01/24.  Also patient reported post losartan  addition she did not noted much BP lowering effect  Today patient presented today for follow up   Patient presents to HTN clinic. Pt was last seen by Dr. Court on 03/2024 where he added losartan  25 mg after noticing diastolics in the high 80s to low 90s range at home. Patient did not bring in her BP machine or log. She has been checking her blood pressure at home and recalls it was 131/91 last night. She hasn't noticed much improvement in BP since losartan  was added. She states that she has been experiencing some slight headaches since starting losartan  but attributes it to her getting used to drug.She denies any SOB, or chest pain. She experiences dizziness sometimes but she reports having a history of vertigo. She is curious if sleep deficiency may affect her blood pressure.   She states that she needs to make improvements in her diet as she consumes a lot of salty and sugary foods. She even craves salty food particuarly in the middle of the night.   Current HTN meds: amlodipine  10 mg daily, hydrochlorothiazide  12.5 mg daily, losartan  25 mg daily, metoprolol  succinate 50 mg daily Previously tried: lisinopril: angioedema; lisinopril-hydrochlorothiazide : angioedema BP goal: <130/80 Scr (02/2024): 0.81 mg/dL Potassium (09/7972): 4.1 mg/dL Sodium (09/7972): 857 mg/dL  Family History:  Relation Problem Comments  Mother (Alive) Arthritis   COPD   Diabetes   Glaucoma    Hypertension   Sleep apnea     Father (Alive) Hypertension   Stroke     Brother Glaucoma   Hypertension     Paternal Uncle Colon cancer     Paternal Uncle Kidney cancer   Prostate cancer     Maternal Grandmother Heart disease   Stroke     Maternal Grandfather COPD   Heart disease     Paternal Grandmother Arthritis   Hypertension   Pancreatic cancer     Paternal Grandfather Heart disease     Other - PGaunt (Deceased) Stomach cancer     Other - PGuncle (Deceased) Prostate cancer     Other - PGuncle (Deceased) Lung cancer      Social History:  Alcohol: none; occasionally she'll drink 1 beer Smoking: none  Diet:  Snacks - potato chips, sweet potato chips, chex mix, lance crackers with peanut butter, almonds and pecans with salt Breakfast- greek yogurt (lactose free) w/pecans/cereal sometimes, two boiled eggs and bacon Lunch - chick fil a or zaxbys  Dinner - tony's seasoning, collards, turnips, potoes, zuchhini, squash, cabbage Drinks: [place] dry ginger ale, welch's grape juice, pineapple juice, 4 bottles of water  Patient is aware that she needs to improve her eating habits and consume foods lower in salt. Patient is willing to decrease potato chips and this time and  work on substituting for healthier alternatives thereon   Exercise:  She walk 3-4x/week for about 30 minutes Part time job  Home BP readings: Pt did not bring in log but recalls BP from last night  Date SBP/DBP   04/22/24 131/91    Wt Readings from Last 3 Encounters:  03/18/24 229 lb 9.6 oz (104.1 kg)  02/19/24 229 lb 6.4 oz (104.1 kg)  10/02/18 227 lb (103 kg)   BP Readings from Last 3 Encounters:  04/23/24 119/84  03/18/24 (!) 122/90  02/19/24 (!) 126/96   Pulse Readings from Last 3 Encounters:  04/23/24 82  03/18/24 64  02/19/24 78    Renal function: CrCl cannot be calculated (Patient's most recent lab result is older than the maximum 21 days allowed.).  Past Medical History:   Diagnosis Date   Arthritis    Family history of breast cancer    Family history of colon cancer    Family history of colon cancer    Family history of pancreatic cancer    Family history of stomach cancer    GERD (gastroesophageal reflux disease)    Glaucoma    H/O echocardiogram 2014   Headache(784.0)    migraines   Hypertension    Mitral valve prolapse    Vestibular vertigo     Current Outpatient Medications on File Prior to Visit  Medication Sig Dispense Refill   amLODipine  (NORVASC ) 10 MG tablet amlodipine  10 mg tablet  Take 1 tablet every day by oral route.     aspirin EC 81 MG tablet Take 81 mg by mouth every morning.     cetirizine (ZYRTEC) 10 MG tablet Take 10 mg by mouth at bedtime.      dorzolamide (TRUSOPT) 2 % ophthalmic solution Place 1 drop into both eyes 2 (two) times daily.     hydrochlorothiazide  (MICROZIDE ) 12.5 MG capsule Take 1 capsule (12.5 mg total) by mouth daily. 90 capsule 3   ibuprofen  (ADVIL ) 800 MG tablet Take 800 mg by mouth every 8 (eight) hours as needed for moderate pain (pain score 4-6).     latanoprost  (XALATAN ) 0.005 % ophthalmic solution Place 1 drop into both eyes at bedtime.      losartan  (COZAAR ) 25 MG tablet Take 1 tablet (25 mg total) by mouth daily. 90 tablet 3   metoprolol  succinate (TOPROL -XL) 50 MG 24 hr tablet Take 50 mg by mouth daily.     nitroGLYCERIN  (NITROSTAT ) 0.4 MG SL tablet Place 1 tablet (0.4 mg total) under the tongue every 5 (five) minutes as needed for chest pain. (Patient not taking: Reported on 03/18/2024) 25 tablet 3   potassium chloride  SA (K-DUR,KLOR-CON ) 20 MEQ tablet Take 1 tablet (20 mEq total) by mouth every morning. 30 tablet 5   sertraline (ZOLOFT) 100 MG tablet Take 100 mg by mouth daily.     No current facility-administered medications on file prior to visit.    Allergies  Allergen Reactions   Lisinopril Other (See Comments)    Facial Edema (Lips)  Other Reaction(s): facial swelling    Lisinopril-Hydrochlorothiazide      Other Reaction(s): angioedema   Oxycodone -Acetaminophen  Itching and Swelling    Other Reaction(s): Dizziness    There were no vitals taken for this visit.   Assessment/Plan:  1. Hypertension -  No problem-specific Assessment & Plan notes found for this encounter.     I agree with assessment and plan.  Thank you  Lakedra E. White, Pharm.D [place] [facility]. [facility] [address], [place], [place] [address] Phone: [phone]; Fax: [phone]    Robbi Blanch, Pharm.D [place] [facility]. [facility] [address], [place], [place] [address] Phone: [phone]; Fax: [phone]

## 2024-07-06 ENCOUNTER — Ambulatory Visit: Admitting: Pharmacist
